# Patient Record
Sex: Female | Born: 2000
Health system: Southern US, Community
[De-identification: ages and names within clinical notes are randomized; demographics above are authoritative.]

## PROBLEM LIST (undated history)

## (undated) DIAGNOSIS — F419 Anxiety disorder, unspecified: Secondary | ICD-10-CM

## (undated) DIAGNOSIS — E063 Autoimmune thyroiditis: Secondary | ICD-10-CM

## (undated) DIAGNOSIS — E049 Nontoxic goiter, unspecified: Secondary | ICD-10-CM

## (undated) DIAGNOSIS — E7800 Pure hypercholesterolemia, unspecified: Secondary | ICD-10-CM

## (undated) DIAGNOSIS — R5383 Other fatigue: Secondary | ICD-10-CM

## (undated) HISTORY — DX: Pure hypercholesterolemia, unspecified: E78.00

## (undated) HISTORY — DX: Anxiety disorder, unspecified: F41.9

## (undated) HISTORY — DX: Nontoxic goiter, unspecified: E04.9

## (undated) HISTORY — DX: Autoimmune thyroiditis: E06.3

## (undated) HISTORY — PX: NO PAST SURGERIES: SHX2092

---
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
## 2019-01-02 ENCOUNTER — Encounter: Payer: Self-pay | Admitting: Internal Medicine

## 2019-01-02 ENCOUNTER — Telehealth: Payer: Self-pay | Admitting: Internal Medicine

## 2019-01-02 ENCOUNTER — Other Ambulatory Visit: Payer: Self-pay

## 2019-01-02 ENCOUNTER — Telehealth (INDEPENDENT_AMBULATORY_CARE_PROVIDER_SITE_OTHER): Payer: BC Managed Care – PPO | Admitting: Internal Medicine

## 2019-01-02 DIAGNOSIS — E038 Other specified hypothyroidism: Secondary | ICD-10-CM

## 2019-01-02 DIAGNOSIS — E7800 Pure hypercholesterolemia, unspecified: Secondary | ICD-10-CM | POA: Diagnosis not present

## 2019-01-02 DIAGNOSIS — E063 Autoimmune thyroiditis: Secondary | ICD-10-CM

## 2019-01-02 MED ORDER — PRAVASTATIN SODIUM 40 MG PO TABS: 40.0000 mg | ORAL_TABLET | Freq: Every day | ORAL | 3 refills | Status: DC

## 2019-01-02 NOTE — Patient Instructions (Signed)
Medication Instructions:  INCREASE pravastatin to 40mg  daily If you need a refill on your cardiac medications before your next appointment, please call your pharmacy.   Lab work: FASTING lab work in 3 months to check cholesterol - prior to next appointment If you have labs (blood work) drawn today and your tests are completely normal, you will receive your results only by: Marland Kitchen MyChart Message (if you have MyChart) OR . A paper copy in the mail If you have any lab test that is abnormal or we need to change your treatment, we will call you to review the results.  Testing/Procedures: NONE  Follow-Up: Dr. Debara Pickett recommends that you schedule a follow up visit with him the in the [place] in 3 months. Please have fasting blood work about 1 week prior to this visit and he will review the blood work results with you at your appointment.

## 2019-01-02 NOTE — Telephone Encounter (Signed)
LVM for patient to call and schedule 3 month lipid followup with Dr. Debara Pickett.

## 2019-01-02 NOTE — Progress Notes (Signed)
Virtual Visit via Telephone Note   This visit type was conducted due to national recommendations for restrictions regarding the COVID-19 Pandemic (e.g. social distancing) in an effort to limit this patient's exposure and mitigate transmission in our community.  Due to her co-morbid illnesses, this patient is at least at moderate risk for complications without adequate follow up.  This format is felt to be most appropriate for this patient at this time.  The patient did not have access to video technology/had technical difficulties with video requiring transitioning to audio format only (telephone).  All issues noted in this document were discussed and addressed.  No physical exam could be performed with this format.  Please refer to the patient's chart for her  consent to telehealth for [facility].   Evaluation Performed:  Lipid consult  Date:  01/02/2019   ID:  Pamela [name], DOB [birth year], MRN [id number]  Patient Location:  [address] [place] [address]  Provider location:   [address], [address] [place], [place] [address]  PCP:  Bing NeighborsHarris, Kimberly S, FNP  Cardiologist:  No primary care provider on file. Electrophysiologist:  None   Chief Complaint:  Manage dyslipidemia  History of Present Illness:    Pamela HughsKatie L Hancher is a 18 y.o. female who presents via audio/video conferencing for a telehealth visit today.  This is a pleasant 18 year old female was kindly referred for evaluation and management of dyslipidemia.  Her past medical history is significant for hypothyroidism secondary to presumed Hashimoto's thyroiditis.  She has been on thyroid medication since she was about 11 and at the time was found to have significant dyslipidemia.  She was started on pravastatin 20 mg daily.  She recently had a lipid profile in June which showed total cholesterol 173, HDL 38, triglycerides 48 and LDL of 120.  As she is at low risk overall, would suspect that this represents a good target LDL  however further evaluation was undertaken.  She did have a lipid NMR performed as well as APO B and LP(a) levels.  Her NMR demonstrated LDL-C of 126 with LDL particle number of 1523.  This was primarily revealing and it showed her small LDL particle number was mildly elevated at 531.  Overall, not significantly abnormal given her risk factors, however LDL particle numbers were higher than expected for her LDL-C.  This is represented by her APO B level which was mildly elevated as well, despite therapy with pravastatin.  The patient does not have symptoms concerning for COVID-19 infection (fever, chills, cough, or new SHORTNESS OF BREATH).    Prior CV studies:   The following studies were reviewed today:  Lab work  PMHx:  Past Medical History:  Diagnosis Date   Anxiety    Fatigue 12/09/2017   Generalized anxiety disorder 11/27/2014   Goiter    Hypercholesterolemia without hypertriglyceridemia    Hypothyroidism, acquired, autoimmune    Pure hypercholesterolemia 09/25/2010   Thyroiditis, autoimmune    Underweight 05/12/2012    Past Surgical History:  Procedure Laterality Date   NO PAST SURGERIES      FAMHx:  Family History  Problem Relation Age of Onset   Hyperlipidemia Mother    Depression Mother    Diabetes Mother    Hypertension Mother    Thyroid disease Maternal Aunt    Hyperlipidemia Maternal Grandmother    Cancer Maternal Grandmother    Graves' disease Maternal Grandmother        Treated with I-131 for Graves' disease.  CAD Maternal Grandmother    Hypertension Maternal Grandfather    Diabetes Paternal Grandmother    Heart disease Paternal Grandfather    Heart attack Paternal Grandfather    Depression Sister    Anxiety disorder Sister    Depression Maternal Aunt    Heart attack Maternal Great-grandmother    Stroke Neg Hx     SOCHx:   reports that she is a non-smoker but has been exposed to tobacco smoke. She has never used smokeless  tobacco. She reports that she does not drink alcohol or use drugs.  ALLERGIES:  No Known Allergies  MEDS:  Current Meds  Medication Sig   FLUoxetine (PROZAC) 20 MG capsule TAKE 1 CAPSULE BY MOUTH AT BEDTIME FOR ANXIETY   medroxyPROGESTERone Acetate 150 MG/ML SUSY INJECT 1ML INTRAMUSCULARLY ONCE EVERY 3 MONTHS   pravastatin (PRAVACHOL) 20 MG tablet Take 1 tablet by mouth once daily   SYNTHROID 50 MCG tablet Take 1 tablet (50 mcg total) by mouth daily before breakfast. Brand Name Medically Necessary     ROS: Pertinent items noted in HPI and remainder of comprehensive ROS otherwise negative.  Labs/Other Tests and Data Reviewed:    Recent Labs: 10/04/2018: ALT 11; TSH 3.260 11/03/2018: BUN 12; Creatinine, Ser 0.92; Hemoglobin 14.0; Platelets 220; Potassium 4.2; Sodium 141   Recent Lipid Panel Lab Results  Component Value Date/Time   CHOL 173 (H) 10/12/2018 12:00 AM   TRIG 48 10/12/2018 12:00 AM   HDL 38 (L) 10/12/2018 12:00 AM   CHOLHDL 4.6 10/12/2018 12:00 AM   LDLCALC 120 (H) 10/12/2018 12:00 AM    Wt Readings from Last 3 Encounters:  12/07/18 108 lb 3.2 oz (49.1 kg) (15 %, Z= -1.05)*  10/21/18 107 lb (48.5 kg) (13 %, Z= -1.12)*  10/12/18 106 lb 9.6 oz (48.4 kg) (13 %, Z= -1.15)*   * Growth percentiles are based on CDC (Girls, 2-20 Years) data.     Exam:    Vital Signs:  There were no vitals taken for this visit.   Exam not performed due to telephone visit  ASSESSMENT & PLAN:    1. Mixed dyslipidemia 2. Low cardiac risk  Ms. Jolene ProvostYawn has had higher than expected cholesterol for years on moderately intense statin therapy with early onset hypothyroidism. There is family history of dyslipidemia and coronary disease, but not early onset. Her lipids suggest an excess particle number that is mildly disproportinate compared to her LDL-C, as evidenced by elevated apoB levels. Since she has been committed to treatment, I would recommend a little more aggressive lowering of  LDL-P and recommend we increase her pravastatin to 40 mg daily. Will repeat lipids in 3 months.   COVID-19 Education: The signs and symptoms of COVID-19 were discussed with the patient and how to seek care for testing (follow up with PCP or arrange E-visit).  The importance of social distancing was discussed today.  Patient Risk:   After full review of this patients clinical status, I feel that they are at least moderate risk at this time.  Time:   Today, I have spent 25 minutes with the patient with telehealth technology discussing lipid management, cardiac risk factors, family history.     Medication Adjustments/Labs and Tests Ordered: Current medicines are reviewed at length with the patient today.  Concerns regarding medicines are outlined above.   Tests Ordered: No orders of the defined types were placed in this encounter.   Medication Changes: No orders of the defined types were placed in this  encounter.   Disposition:  in 3 month(s)  Pixie Casino, MD, [facility], [place] Director of the Advanced Lipid Disorders &  Cardiovascular Risk Reduction Clinic Diplomate of the American Board of Clinical Lipidology Attending Cardiologist  Direct Dial: [phone]   Fax: [phone]  Website:  [email or web address]  Pixie Casino, MD  01/02/2019 10:13 AM

## 2019-01-10 ENCOUNTER — Telehealth: Payer: Self-pay

## 2019-01-12 ENCOUNTER — Encounter: Payer: Self-pay | Admitting: Internal Medicine

## 2019-01-12 ENCOUNTER — Telehealth (INDEPENDENT_AMBULATORY_CARE_PROVIDER_SITE_OTHER): Payer: BC Managed Care – PPO | Admitting: Internal Medicine

## 2019-01-12 ENCOUNTER — Other Ambulatory Visit: Payer: Self-pay

## 2019-01-12 VITALS — Wt 107.0 lb

## 2019-01-12 DIAGNOSIS — I951 Orthostatic hypotension: Secondary | ICD-10-CM | POA: Diagnosis not present

## 2019-01-12 DIAGNOSIS — G90A Postural orthostatic tachycardia syndrome (POTS): Secondary | ICD-10-CM

## 2019-01-12 DIAGNOSIS — R000 Tachycardia, unspecified: Secondary | ICD-10-CM

## 2019-01-12 DIAGNOSIS — R55 Syncope and collapse: Secondary | ICD-10-CM

## 2019-01-12 NOTE — Progress Notes (Signed)
Electrophysiology TeleHealth Note   Due to national recommendations of social distancing due to COVID 19, an audio/video telehealth visit is felt to be most appropriate for this patient at this time.  See MyChart message from today for the patient's consent to telehealth for [facility].   Date:  01/12/2019   ID:  Pamela [name], DOB Jul 11, 2000, MRN [id number]  Location: patient's home  Provider location: [address], [place]  Evaluation Performed: Follow-up visit  PCP:  Scot Jun, FNP  Cardiologist:   Electrophysiologist:  SK   Chief Complaint:  Dizziness   History of Present Illness:    Pamela [name] is a 18 y.o. female who presents via audio/video conferencing for a telehealth visit today.  Since last being seen in our clinic for postural tachycardia and syncope  the patient reports doing better  Less orthostatic with standing So prolonged standing is still a challenge  Hx of arachnodactyly and joint laxity.  Anxiety   Tried Tri-oral but did not tolerate  Works at [employer] not hot.    Also complains of pain rather diffusely     The patient denies symptoms of fevers, chills, cough, or new SOB worrisome for COVID 19.    Past Medical History:  Diagnosis Date  . Anxiety   . Fatigue 12/09/2017  . Generalized anxiety disorder 11/27/2014  . Goiter   . Hypercholesterolemia without hypertriglyceridemia   . Hypothyroidism, acquired, autoimmune   . Pure hypercholesterolemia 09/25/2010  . Thyroiditis, autoimmune   . Underweight 05/12/2012    Past Surgical History:  Procedure Laterality Date  . NO PAST SURGERIES      Current Outpatient Medications  Medication Sig Dispense Refill  . FLUoxetine (PROZAC) 20 MG capsule TAKE 1 CAPSULE BY MOUTH AT BEDTIME FOR ANXIETY  2  . medroxyPROGESTERone Acetate 150 MG/ML SUSY INJECT 1ML INTRAMUSCULARLY ONCE EVERY 3 MONTHS    . pravastatin (PRAVACHOL) 40 MG tablet Take 1 tablet (40 mg total) by  mouth daily. 90 tablet 3  . SYNTHROID 50 MCG tablet Take 1 tablet (50 mcg total) by mouth daily before breakfast. Brand Name Medically Necessary 30 tablet 6   No current facility-administered medications for this visit.     Allergies:   Patient has no known allergies.   Social History:  The patient  reports that she is a non-smoker but has been exposed to tobacco smoke. She has never used smokeless tobacco. She reports that she does not drink alcohol or use drugs.   Family History:  The patient's   family history includes Anxiety disorder in her sister; CAD in her maternal grandmother; Cancer in her maternal grandmother; Depression in her maternal aunt, mother, and sister; Diabetes in her mother and paternal grandmother; Berenice Primas' disease in her maternal grandmother; Heart attack in her maternal great-grandmother and paternal grandfather; Heart disease in her paternal grandfather; Hyperlipidemia in her maternal grandmother and mother; Hypertension in her maternal grandfather and mother; Thyroid disease in her maternal aunt.   ROS:  Please see the history of present illness.   All other systems are personally reviewed and negative.    Exam:    Vital Signs:  There were no vitals taken for this visit.    Well appearing, alert and conversant, regular work of breathing,  good skin color Eyes- anicteric, neuro- grossly intact, skin- no apparent rash or lesions or cyanosis, mouth- oral mucosa is pink   Labs/Other Tests and Data Reviewed:    Recent Labs:  10/04/2018: ALT 11; TSH 3.260 11/03/2018: BUN 12; Creatinine, Ser 0.92; Hemoglobin 14.0; Platelets 220; Potassium 4.2; Sodium 141   Wt Readings from Last 3 Encounters:  12/07/18 108 lb 3.2 oz (49.1 kg) (15 %, Z= -1.05)*  10/21/18 107 lb (48.5 kg) (13 %, Z= -1.12)*  10/12/18 106 lb 9.6 oz (48.4 kg) (13 %, Z= -1.15)*   * Growth percentiles are based on CDC (Girls, 2-20 Years) data.     Other studies personally reviewed:     ASSESSMENT &  PLAN:    Dysautonomia with evidence of POTS but also syncope  Arachnodactyly  Joint laxity  Anxiety  Not tolerated tir-oral  Have vercommended she try' Peidalyte [facility] or Liquid IV Need to keep visual track of urine color Requested and appropriate so will get her handicapped sticker Raised the issue of disablitty  m ay consider florinef    COVID 19 screen The patient denies symptoms of COVID 19 at this time.  The importance of social distancing was discussed today.  Follow-up:4 weeks  telehealth visit      Current medicines are reviewed at length with the patient today.     No orders of the defined types were placed in this encounter.   Future tests ( post COVID )     Patient Risk:  after full review of this patients clinical status, I feel that they are at moderate  risk at this time.  Today, I have spent 16 minutes with the patient with telehealth technology discussing the above.  Signed, Sherryl MangesSteven , MD  01/12/2019 11:42 AM     [facility] [address] [place] [address] [phone] (office) [phone] (fax)

## 2019-01-12 NOTE — Patient Instructions (Signed)
Medication Instructions: Your physician recommends that you continue on your current medications as directed. Please refer to the Current Medication list given to you today.  If you need a refill on your cardiac medications before your next appointment, please call your pharmacy.   Lab work:none ordered If you have labs (blood work) drawn today and your tests are completely normal, you will receive your results only by: Marland Kitchen MyChart Message (if you have MyChart) OR . A paper copy in the mail If you have any lab test that is abnormal or we need to change your treatment, we will call you to review the results.  Testing/Procedures: None ordered  Follow-Up: Dr. Caryl Comes in 4 weeks for TELEVISIT  At [facility], you and your health needs are our priority.  As part of our continuing mission to provide you with exceptional heart care, we have created designated Provider Care Teams.  These Care Teams include your primary Cardiologist (physician) and Advanced Practice Providers (APPs -  Physician Assistants and Nurse Practitioners) who all work together to provide you with the care you need, when you need it.   Any Other Special Instructions Will Be Listed Below (If Applicable).

## 2019-01-17 ENCOUNTER — Telehealth: Payer: Self-pay | Admitting: Internal Medicine

## 2019-01-17 NOTE — Telephone Encounter (Signed)
Handicap form left at front desk.

## 2019-01-17 NOTE — Telephone Encounter (Signed)
New message   Called pt to schedule 4 week f/u appt per Dr. Caryl Comes. Pt said that when she spoke to Dr. Caryl Comes last she mentioned a disability parking pass and he said someone would call her and she never got a call. Please call

## 2019-01-23 ENCOUNTER — Telehealth: Payer: Self-pay | Admitting: Internal Medicine

## 2019-01-23 NOTE — Telephone Encounter (Signed)
  Patient came to office today to pick up a letter from Dr Caryl Comes for her to get a handicap sticker for her car. The mom is calling because they were told no one could find the letter but she states she was told to come get it that it was ready. She is off tomorrow and would like to come pick it. Please call when ready.

## 2019-01-24 NOTE — Telephone Encounter (Signed)
Spoke with pt's mother. Pt's handicap letter was placed in the mail. She should be receiving it within the week. If she does not, I will have another one filled out and sent to her.   She had no additional questions.

## 2019-02-02 NOTE — Telephone Encounter (Signed)
Patient's mother would like a call about the handicap letter that they never received.  Please call, thank you.

## 2019-02-02 NOTE — Telephone Encounter (Signed)
  Mother is calling because the letter for the handicap sticker was never received and they were told by the nurse that Dr Caryl Comes would be in the office on Tuesday 01/31/19 and would do another one for them to pick up. She is calling today because they have not heard anything from the office about the letter being ready. Please advise.

## 2019-02-06 NOTE — Telephone Encounter (Signed)
  Patient's mom called to check on letter for the handicap sticker. I spoke with Ander Purpura and she stated Dr Caryl Comes will be in the office on 02/07/19 and she will get another letter from him and put it up front for mom to pick up. Mom is asking to be able to pick up letter tomorrow before end of the day.

## 2019-02-06 NOTE — Telephone Encounter (Signed)
Appointment

## 2019-02-07 NOTE — Telephone Encounter (Signed)
Handicap paper left downstairs with check in. Pt is aware.

## 2019-02-20 ENCOUNTER — Telehealth (INDEPENDENT_AMBULATORY_CARE_PROVIDER_SITE_OTHER): Payer: Self-pay | Admitting: Pediatric Endocrinology

## 2019-02-20 NOTE — Telephone Encounter (Signed)
Returned call to mom.   She saw a lipid specialist who increased the Pravachol from 20 mg to 40 mg. Since then she has increased joint pain. Her cardiologist said that the joint pain could be related to the increased dose.   1) ok to resume 20 mg of Pravachol 2) Should call the provider who prescribed the increased dose to let him know that you are having issues.  3) If symptome improve on the lower dose then it will be easier to say that the Pravachol may be causative.  4) if concerns for Marfans or Erlos Danlos Syndrome- should see ophthalmology for dilated exam.   Mom and Shiree were thankful for call.   Lelon Huh, MD

## 2019-02-20 NOTE — Telephone Encounter (Signed)
Routed to provider

## 2019-02-20 NOTE — Telephone Encounter (Signed)
°  Who's calling (name and relationship to patient) : Kuhnle,Sherry Best contact number: [phone] Provider they see: Baldo Ash Reason for call: Shaneeka is being treated for POTS by a cardiologist.  She has resently started to have joint pain that could possibly be related to the increase in her Pravachol.  Mom would like Dr. Montey Hora opinion of this.  Please call.    PRESCRIPTION REFILL ONLY  Name of prescription:  Pharmacy:

## 2019-03-03 ENCOUNTER — Telehealth (INDEPENDENT_AMBULATORY_CARE_PROVIDER_SITE_OTHER): Payer: BC Managed Care – PPO | Admitting: Internal Medicine

## 2019-03-03 ENCOUNTER — Encounter: Payer: Self-pay | Admitting: Internal Medicine

## 2019-03-03 ENCOUNTER — Other Ambulatory Visit: Payer: Self-pay

## 2019-03-03 VITALS — Ht 65.36 in | Wt 105.0 lb

## 2019-03-03 DIAGNOSIS — I951 Orthostatic hypotension: Secondary | ICD-10-CM

## 2019-03-03 DIAGNOSIS — I10 Essential (primary) hypertension: Secondary | ICD-10-CM

## 2019-03-03 NOTE — Progress Notes (Signed)
Electrophysiology TeleHealth Note   Due to national recommendations of social distancing due to COVID 19, an audio/video telehealth visit is felt to be most appropriate for this patient at this time.  See MyChart message from today for the patient's consent to telehealth for [facility].   Date:  03/03/2019   ID:  Pamela [name], DOB 30-Aug-2000, MRN [id number]  Location: patient's home  Provider location: [address] [place]  Evaluation Performed: Follow-up visit  PCP:  Bing Neighbors, FNP  Cardiologist:   [facility] Electrophysiologist:  SK   Chief Complaint:  dysautonomia  History of Present Illness:    Pamela [name] is a 18 y.o. female who presents via audio/video conferencing for a telehealth visit today.  Since last being seen in our clinic forPOTS/syncope with arachnodactyly initially improved with volume support  the patient reports   Still with orthostatic dizziness, but no syncope--temporally assoc with her period.   Also with orhtostatic headaches-- worse with standing.    Discussed alternative salt/water repletion, did not tolerate tri-oral  Lots of joint pain--   The patient denies symptoms of fevers, chills, cough, or new SOB worrisome for COVID 19.   Past Medical History:  Diagnosis Date  . Anxiety   . Fatigue 12/09/2017  . Generalized anxiety disorder 11/27/2014  . Goiter   . Hypercholesterolemia without hypertriglyceridemia   . Hypothyroidism, acquired, autoimmune   . Pure hypercholesterolemia 09/25/2010  . Thyroiditis, autoimmune   . Underweight 05/12/2012    Past Surgical History:  Procedure Laterality Date  . NO PAST SURGERIES      Current Outpatient Medications  Medication Sig Dispense Refill  . FLUoxetine (PROZAC) 20 MG capsule TAKE 1 CAPSULE BY MOUTH AT BEDTIME FOR ANXIETY  2  . medroxyPROGESTERone Acetate 150 MG/ML SUSY INJECT INTRAMUSCULARLY ONCE EVERY 3 MONTHS    . pravastatin (PRAVACHOL) 40 MG tablet Take 1 tablet (40 mg  total) by mouth daily. 90 tablet 3  . SYNTHROID 50 MCG tablet Take 1 tablet (50 mcg total) by mouth daily before breakfast. Brand Name Medically Necessary 30 tablet 6   No current facility-administered medications for this visit.     Allergies:   Patient has no known allergies.   Social History:  The patient  reports that she is a non-smoker but has been exposed to tobacco smoke. She has never used smokeless tobacco. She reports that she does not drink alcohol or use drugs.   Family History:  The patient's   family history includes Anxiety disorder in her sister; CAD in her maternal grandmother; Cancer in her maternal grandmother; Depression in her maternal aunt, mother, and sister; Diabetes in her mother and paternal grandmother; Luiz Blare' disease in her maternal grandmother; Heart attack in her maternal great-grandmother and paternal grandfather; Heart disease in her paternal grandfather; Hyperlipidemia in her maternal grandmother and mother; Hypertension in her maternal grandfather and mother; Thyroid disease in her maternal aunt.   ROS:  Please see the history of present illness.   All other systems are personally reviewed and negative.    Exam:    Vital Signs:  Ht 5' 5.36" (1.66 m)   Wt 105 lb (47.6 kg)   BMI 17.28 kg/m     Labs/Other Tests and Data Reviewed:    Recent Labs: 10/04/2018: ALT 11; TSH 3.260 11/03/2018: BUN 12; Creatinine, Ser 0.92; Hemoglobin 14.0; Platelets 220; Potassium 4.2; Sodium 141   Wt Readings from Last 3 Encounters:  03/03/19 105 lb (47.6  kg) (9 %, Z= -1.32)*  01/12/19 107 lb (48.5 kg) (13 %, Z= -1.15)*  12/07/18 108 lb 3.2 oz (49.1 kg) (15 %, Z= -1.05)*   * Growth percentiles are based on CDC (Girls, 2-20 Years) data.     Other studies personally reviewed:     ASSESSMENT & PLAN:   Dysautonomia with evidence of POTS but also syncope  Arachnodactyly  Joint laxity  Anxiety  Still with dizziness with orthostasis but no interval syncope.  No  clear culprit symptoms associated temporally with her period coming on.  Encouraged salt and water repletion   Have reached out to DR Valentino Nose as to whether there is a joint laxity EDS MD in the area   COVID 19 screen The patient denies symptoms of COVID 19 at this time.  The importance of social distancing was discussed today.  Follow-up:  4-6w telehealth visit      Current medicines are reviewed at length with the patient today.   The patient does not have concerns regarding her medicines.  The following changes were made today:  none  Labs/ tests ordered today include:   No orders of the defined types were placed in this encounter.   Future tests ( post COVID )    in    months  Patient Risk:  after full review of this patients clinical status, I feel that they are at moderate risk at this time.  Today, I have spent 12 minutes with the patient with telehealth technology discussing the above.  Signed, Virl Axe, MD  03/03/2019 3:58 PM     [place] [address] [place] [address] [phone] (office) [phone] (fax)

## 2019-03-16 ENCOUNTER — Other Ambulatory Visit (INDEPENDENT_AMBULATORY_CARE_PROVIDER_SITE_OTHER): Payer: Self-pay | Admitting: Pediatric Endocrinology

## 2019-03-17 DIAGNOSIS — Z3042 Encounter for surveillance of injectable contraceptive: Secondary | ICD-10-CM | POA: Diagnosis not present

## 2019-04-17 ENCOUNTER — Ambulatory Visit (INDEPENDENT_AMBULATORY_CARE_PROVIDER_SITE_OTHER): Payer: Self-pay | Admitting: Pediatric Endocrinology

## 2019-04-20 ENCOUNTER — Telehealth (INDEPENDENT_AMBULATORY_CARE_PROVIDER_SITE_OTHER): Payer: Self-pay | Admitting: Internal Medicine

## 2019-04-20 ENCOUNTER — Other Ambulatory Visit: Payer: Self-pay

## 2019-04-20 ENCOUNTER — Encounter: Payer: Self-pay | Admitting: Internal Medicine

## 2019-04-20 VITALS — Ht 65.37 in | Wt 110.0 lb

## 2019-04-20 DIAGNOSIS — I498 Other specified cardiac arrhythmias: Secondary | ICD-10-CM

## 2019-04-20 DIAGNOSIS — E063 Autoimmune thyroiditis: Secondary | ICD-10-CM

## 2019-04-20 DIAGNOSIS — R55 Syncope and collapse: Secondary | ICD-10-CM

## 2019-04-20 DIAGNOSIS — I951 Orthostatic hypotension: Secondary | ICD-10-CM

## 2019-04-20 DIAGNOSIS — E7800 Pure hypercholesterolemia, unspecified: Secondary | ICD-10-CM

## 2019-04-20 DIAGNOSIS — G90A Postural orthostatic tachycardia syndrome (POTS): Secondary | ICD-10-CM

## 2019-04-20 DIAGNOSIS — E038 Other specified hypothyroidism: Secondary | ICD-10-CM

## 2019-04-20 NOTE — Progress Notes (Signed)
[name]    Electrophysiology TeleHealth Note   Due to national recommendations of social distancing due to [place] 19, an audio/video telehealth visit is felt to be most appropriate for this patient at this time.  See MyChart message from today for the patient's consent to telehealth for [name].   Date:  04/20/2019   ID:  Pamela [name], DOB Oct 18, 2000, MRN [id number]  Location: patient's home  Provider location: [address] [place]  Evaluation Performed: Follow-up visit  PCP:  Scot Jun, FNP  Cardiologist:   [facility] Electrophysiologist:  SK   Chief Complaint:  dysautonomia  History of Present Illness:    Pamela [name] is a 18 y.o. female who presents via audio/video conferencing for a telehealth visit today.  Since last being seen in our clinic forPOTS/syncope with arachnodactyly initially improved with volume support  the patient reports recent recurrences of syncope  Palps with exertion  Has tried some of the salt repletion formulas but does not like them--     The patient denies symptoms of fevers, chills, cough, or new SOB worrisome for COVID 19.   Past Medical History:  Diagnosis Date  . Anxiety   . Fatigue 12/09/2017  . Generalized anxiety disorder 11/27/2014  . Goiter   . Hypercholesterolemia without hypertriglyceridemia   . Hypothyroidism, acquired, autoimmune   . Pure hypercholesterolemia 09/25/2010  . Thyroiditis, autoimmune   . Underweight 05/12/2012    Past Surgical History:  Procedure Laterality Date  . NO PAST SURGERIES      Current Outpatient Medications  Medication Sig Dispense Refill  . FLUoxetine (PROZAC) 20 MG capsule TAKE 1 CAPSULE BY MOUTH AT BEDTIME FOR ANXIETY  2  . medroxyPROGESTERone Acetate 150 MG/ML SUSY INJECT 1ML INTRAMUSCULARLY ONCE EVERY 3 MONTHS    . pravastatin (PRAVACHOL) 40 MG tablet Take 1 tablet (40 mg total) by mouth daily. 90 tablet 3  . SYNTHROID 50 MCG tablet TAKE 1 TABLET BY MOUTH ONCE DAILY BEFORE  BREAKFAST 90 tablet 1   No current facility-administered medications for this visit.    Allergies:   Patient has no known allergies.   Social History:  The patient  reports that she is a non-smoker but has been exposed to tobacco smoke. She has never used smokeless tobacco. She reports that she does not drink alcohol or use drugs.   Family History:  The patient's   family history includes Anxiety disorder in her sister; CAD in her maternal grandmother; Cancer in her maternal grandmother; Depression in her maternal aunt, mother, and sister; Diabetes in her mother and paternal grandmother; Berenice Primas' disease in her maternal grandmother; Heart attack in her maternal great-grandmother and paternal grandfather; Heart disease in her paternal grandfather; Hyperlipidemia in her maternal grandmother and mother; Hypertension in her maternal grandfather and mother; Thyroid disease in her maternal aunt.   ROS:  Please see the history of present illness.   All other systems are personally reviewed and negative.    Exam:    Vital Signs:  Ht 5' 5.37" (1.66 m)   Wt 110 lb (49.9 kg)   BMI 18.10 kg/m     Labs/Other Tests and Data Reviewed:    Recent Labs: 10/04/2018: ALT 11; TSH 3.260 11/03/2018: BUN 12; Creatinine, Ser 0.92; Hemoglobin 14.0; Platelets 220; Potassium 4.2; Sodium 141   Wt Readings from Last 3 Encounters:  04/20/19 110 lb (49.9 kg) (17 %, Z= -0.96)*  03/03/19 105 lb (47.6 kg) (9 %, Z= -1.32)*  01/12/19 107 lb (48.5  kg) (13 %, Z= -1.15)*   * Growth percentiles are based on CDC (Girls, 2-20 Years) data.     Other studies personally reviewed:     ASSESSMENT & PLAN:   Dysautonomia with evidence of POTS but also syncope  Arachnodactyly  Joint laxity  Anxiety  No clear trigger of worsening syncope--have encouraged her to try Pedialyte advanced Care as a supplement  Encouraged exercise  COVID 19 screen The patient denies symptoms of COVID 19 at this time.  The importance of  social distancing was discussed today.  Follow-up:  8 w telehealth visit      Current medicines are reviewed at length with the patient today.   The patient does not have concerns regarding her medicines.  The following changes were made today:  none  Labs/ tests ordered today include:   No orders of the defined types were placed in this encounter.   Future tests ( post COVID )    in    months  Patient Risk:  after full review of this patients clinical status, I feel that they are at moderate risk at this time.  Today, I have spent 12  minutes with the patient with telehealth technology discussing the above.  Signed, Sherryl Manges, MD  04/20/2019 2:02 PM     [facility] [address] [place] [address] [phone] (office) [phone] (fax)

## 2019-05-22 ENCOUNTER — Encounter (INDEPENDENT_AMBULATORY_CARE_PROVIDER_SITE_OTHER): Payer: Self-pay | Admitting: Pediatric Endocrinology

## 2019-05-22 ENCOUNTER — Ambulatory Visit (INDEPENDENT_AMBULATORY_CARE_PROVIDER_SITE_OTHER): Payer: Self-pay | Admitting: Pediatric Endocrinology

## 2019-05-22 ENCOUNTER — Other Ambulatory Visit: Payer: Self-pay

## 2019-05-22 VITALS — BP 102/60 | HR 76 | Ht 65.35 in | Wt 106.3 lb

## 2019-05-22 DIAGNOSIS — E063 Autoimmune thyroiditis: Secondary | ICD-10-CM

## 2019-05-22 DIAGNOSIS — E7800 Pure hypercholesterolemia, unspecified: Secondary | ICD-10-CM

## 2019-05-22 MED ORDER — PRAVASTATIN SODIUM 20 MG PO TABS: 20.0000 mg | ORAL_TABLET | Freq: Every day | ORAL | 3 refills | Status: DC

## 2019-05-22 MED ORDER — LEVOTHYROXINE SODIUM 50 MCG PO TABS: ORAL_TABLET | ORAL | 3 refills | Status: DC

## 2019-05-22 NOTE — Patient Instructions (Signed)
Pravachol 20 mg Synthroid 50 mcg (generic ok).   Labs next visit

## 2019-05-22 NOTE — Progress Notes (Signed)
Subjective:  Patient Name: Pamela [name] Date of Birth: 2000/09/17  MRN: [id number]  Pamela [name]  presents to the office today for follow-up evaluation and management of her hypercholesterolemia, goiter, Hashimoto's thyroiditis, and hypothyroidism.   HISTORY OF PRESENT ILLNESS:   [name] is a 19 y.o. Caucasian female   [name] was accompanied by her mother   1. [name] was first referred to our clinic on 11/12/09 by her primary care pediatrician, Dr. Johny Drilling, for evaluation and management of elevated cholesterol. Patient was then 9-8/20 years old. She had been previously quite healthy. Dr. Rex Kras perform a routine screening for cholesterol in his office. When that result was high, he repeated the blood test. The second cholesterol was still high. Family history was positive for diabetes in a paternal grandmother. Mother and maternal aunt have had thyroid problems. Paternal grandfather had had a myocardial infarction. Maternal great-grandmother died of an MI. Mother's cholesterol was noted to be "a little high". Mother and maternal uncle were both obese. Paternal grandfather had hypertension. Review of Dr. Eddie Dibbles laboratory results showed total cholesterols of 227 and 288. Triglycerides were 57 and 58. HDLs were both 57. LDLs were 159 and 160. Additional laboratory studies included a normal CMP. Her TSH was slightly elevated at 4.063. Her free T4 was 1.42. Her free T3 was 4.8. Her TPO antibody was borderline elevated at 38.4. Because of the association of hypothyroidism with hypercholesterolemia, we repeated the TFTs one month later. TSH was even higher at 5.823. Free T4 was 1.37. Free T3 was 5.2. Repeat TFTs 2 months later showed that the TSH was 3.514. Free T4 was 1.12. Free T3 was 4.3. At that point, Dr. Tobe Sos started her on Synthroid, 25 mcg per day. Her cholesterol values initially seemed to respond to the thyroid hormone. However, she did have a total cholesterol of 267 on 04/25/11 and was started on  Pravachol at that time. She initially had some concerns regarding headaches and stomachaches but they resolved.       2. The patient's last [facility] visit was on 10/12/18. In the interim, she has been generally healthy.    Thyroid  She has continued on 50 mcg of Synthroid daily. She feels that she is doing well with it. She has been taking it daily.   Energy level is ok- she is often tired.  She denies constipation Menses are irregular- on Depot Provera.   She is working at [employer] and at [employer]. She wears a mask at work.   She tends to go to bed between 10p and MN. She is tired from working 3 jobs.   She has not been passing out as often. She has follows with cardiology.   Hyperlipidemia She has continued on Pravachol. She is taking it daily. She does not feel that she has any issues with it.   She was referred to a lipid specialist at her cardiology office who recommended that she increase her Pravachol to 40 mg daily due to mild increase in small LDL-particle, particle number, and LDL-C. She felt that she had an increase in joint pain with the increase in dose which improved when she resumed 20 mg daily.   She has switched to Depot Provera for her [facility] and is having irregular bleeding. She is scheduled to see her GYN for this issue.   She is still taking Prozac for her depression/anxiety. She is not currently seeing a therapist.    3. Pertinent Review of Systems:  Constitutional: The patient  feels "tired and sore".  Pain in hips/shoulders.   Eyes: Vision seems to be good. There are no recognized eye problems. Wears glasses for reading Neck: The patient has no complaints of anterior neck swelling, soreness, tenderness, pressure, discomfort, or difficulty swallowing.   Heart: Heart rate increases with exercise or other physical activity. The patient has no complaints of palpitations, irregular heart beats, chest pain, or chest pressure.   Lungs no wheezing or breathing  difficulty.  Gastrointestinal: Bowel movents seem normal. The patient has no complaints of excessive hunger, acid reflux, upset stomach, stomach aches or pains, diarrhea, or constipation.  Legs: Muscle mass and strength seem normal. There are no complaints of numbness, tingling, burning, or pain. No edema is noted.  Feet: There are no obvious foot problems. There are no complaints of numbness, tingling, burning, or pain. No edema is noted. Neurologic: There are no recognized problems with muscle movement and strength, sensation, or coordination. Having issues with panic attacks and insomnia GYN/GU: Now on Depot Provera.   PAST MEDICAL, FAMILY, AND SOCIAL HISTORY  Past Medical History:  Diagnosis Date  . Anxiety   . Fatigue 12/09/2017  . Generalized anxiety disorder 11/27/2014  . Goiter   . Hypercholesterolemia without hypertriglyceridemia   . Hypothyroidism, acquired, autoimmune   . Pure hypercholesterolemia 09/25/2010  . Thyroiditis, autoimmune   . Underweight 05/12/2012    Family History  Problem Relation Age of Onset  . Hyperlipidemia Mother   . Depression Mother   . Diabetes Mother   . Hypertension Mother   . Thyroid disease Maternal Aunt   . Hyperlipidemia Maternal Grandmother   . Cancer Maternal Grandmother   . Graves' disease Maternal Grandmother        Treated with I-131 for Graves' disease.  . CAD Maternal Grandmother   . Hypertension Maternal Grandfather   . Diabetes Paternal Grandmother   . Heart disease Paternal Grandfather   . Heart attack Paternal Grandfather   . Depression Sister   . Anxiety disorder Sister   . Depression Maternal Aunt   . Heart attack Maternal Great-grandmother   . Stroke Neg Hx      Current Outpatient Medications:  .  FLUoxetine (PROZAC) 20 MG capsule, TAKE 1 CAPSULE BY MOUTH AT BEDTIME FOR ANXIETY, Disp: , Rfl: 2 .  levothyroxine (SYNTHROID) 50 MCG tablet, TAKE 1 TABLET BY MOUTH ONCE DAILY BEFORE BREAKFAST, Disp: 90 tablet, Rfl: 3 .   medroxyPROGESTERone Acetate 150 MG/ML SUSY, INJECT INTRAMUSCULARLY ONCE EVERY 3 MONTHS, Disp: , Rfl:  .  pravastatin (PRAVACHOL) 20 MG tablet, Take 1 tablet (20 mg total) by mouth daily., Disp: 90 tablet, Rfl: 3  Allergies as of 05/22/2019  . (No Known Allergies)     reports that she is a non-smoker but has been exposed to tobacco smoke. She has never used smokeless tobacco. She reports that she does not drink alcohol or use drugs. Pediatric History  Patient Parents  . Alexander Bergeron (Mother)   Other Topics Concern  . Not on file  Social History Narrative   Lives with Mom, dad, sister, and 1 dog. Plays outside. Very active. No in school or college   12th grade at [employer]  - she has graduated HS. She is thinking about going into forensics.  Working at [employer]. Walking.  Primary Care Provider: Bing Neighbors, FNP  ROS: There are no other significant problems involving Kazandra's other body systems.   Objective:  Vital Signs:  BP 102/60   Pulse 76  Ht 5' 5.35" (1.66 m)   Wt 106 lb 4.8 oz (48.2 kg)   BMI 17.50 kg/m     Ht Readings from Last 3 Encounters:  05/22/19 5' 5.35" (1.66 m) (66 %, Z= 0.42)*  04/20/19 5' 5.37" (1.66 m) (67 %, Z= 0.43)*  03/03/19 5' 5.36" (1.66 m) (67 %, Z= 0.43)*   * Growth percentiles are based on CDC (Girls, 2-20 Years) data.   Wt Readings from Last 3 Encounters:  05/22/19 106 lb 4.8 oz (48.2 kg) (11 %, Z= -1.24)*  04/20/19 110 lb (49.9 kg) (17 %, Z= -0.96)*  03/03/19 105 lb (47.6 kg) (9 %, Z= -1.32)*   * Growth percentiles are based on CDC (Girls, 2-20 Years) data.   HC Readings from Last 3 Encounters:  No data found for [facility]   Body surface area is 1.49 meters squared. 66 %ile (Z= 0.42) based on CDC (Girls, 2-20 Years) Stature-for-age data based on Stature recorded on 05/22/2019. 11 %ile (Z= -1.24) based on CDC (Girls, 2-20 Years) weight-for-age data using vitals from 05/22/2019.    PHYSICAL EXAM:  Constitutional: The  patient appears healthy and well nourished. The patient's height and weight are normal for age. Weight is stable Head: The head is normocephalic.  Face: The face appears normal. There are no obvious dysmorphic features. Eyes: The eyes appear to be normally formed and spaced. Gaze is conjugate. There is no obvious arcus or proptosis. Moisture appears normal. Ears: The ears are normally placed and appear externally normal. Multiple ear piercings.  Mouth: The oropharynx and tongue appear normal. Dentition appears to be normal for age. Oral moisture is normal. Neck: The neck appears to be visibly normal. The thyroid gland is 13 grams in size. The consistency of the thyroid gland is normal. The thyroid gland is not tender to palpation. Lungs: Normal work of breathing Heart: normal pulses and peripheral perfusion Abdomen: The abdomen appears to be normal in size for the patient's age. There is no obvious hepatomegaly, splenomegaly, or other mass effect.  Arms: Muscle size and bulk are normal for age. Hands:  Phalangeal and metacarpophalangeal joints are normal. Palmar muscles are normal for age. Palmar skin is normal. Palmar moisture is also normal. Legs: Muscles appear normal for age. No edema is present. Feet: Feet are normally formed. Dorsalis pedal pulses are normal. Neurologic: Strength is normal for age in both the upper and lower extremities. Muscle tone is normal. Sensation to touch is normal in both the legs and feet.    LAB DATA:   Pending No results found for this or any previous visit (from the past 504 hour(s)).   Assessment and Plan:   ASSESSMENT: Lorna is a 19 y.o. caucasian female followed for autoimmune aquired hypothyroid and hyperlipidemia.    Hyperlipidemia - Reports good compliance with Pravachol- but prefers the 20 mg dose tabs - Thinks she is eating ok - Repeat labs next visit  Thyroid - Currently on 50 mcg of Synthroid daily - clinically euthyroid - Labs next visit.    PLAN:  1. Diagnostic: none today 2. Therapeutic: No change to synthroid or Pravachol- prescriptions sent to pharmacy and coupons from [facility] 3. Patient education:  Reveiwed medication compliance and diet compliance concerns.  Discussed transition to adult care- she can continue to come here for now (until age 1). Family reassured.   4. Follow-up: Return in about 3 months (around 08/20/2019).     Dessa Phi, MD  Level of Service: This visit lasted in excess  of 40 minutes. More than 50% of the visit was devoted to counseling.

## 2019-05-24 ENCOUNTER — Telehealth: Payer: Self-pay | Admitting: Radiology

## 2019-05-24 DIAGNOSIS — R55 Syncope and collapse: Secondary | ICD-10-CM | POA: Insufficient documentation

## 2019-05-24 DIAGNOSIS — G901 Familial dysautonomia [Riley-Day]: Secondary | ICD-10-CM | POA: Insufficient documentation

## 2019-05-24 NOTE — Telephone Encounter (Addendum)
Exercise treadmill test was ordered back in June 2020 when the treadmill room was closed due to [place]. Please advise if patient still needs test scheduled. Patient has seen Graciela Husbands a few times since order was placed.   If no longer needed please cancel order.

## 2019-05-24 NOTE — Telephone Encounter (Signed)
Please find out if patient has had any further CP 

## 2019-05-24 NOTE — Telephone Encounter (Signed)
Left message to call back  

## 2019-05-25 ENCOUNTER — Telehealth (INDEPENDENT_AMBULATORY_CARE_PROVIDER_SITE_OTHER): Payer: Self-pay | Admitting: Internal Medicine

## 2019-05-25 ENCOUNTER — Other Ambulatory Visit: Payer: Self-pay

## 2019-05-25 DIAGNOSIS — G901 Familial dysautonomia [Riley-Day]: Secondary | ICD-10-CM

## 2019-05-25 DIAGNOSIS — R55 Syncope and collapse: Secondary | ICD-10-CM

## 2019-05-25 NOTE — Progress Notes (Signed)
Electrophysiology TeleHealth Note   Due to national recommendations of social distancing due to COVID 19, an audio/video telehealth visit is felt to be most appropriate for this patient at this time.  See MyChart message from today for the patient's consent to telehealth for [facility].   Date:  05/25/2019   ID:  Pamela [name], DOB 19-Sep-2000, MRN [id number]  Location: patient's home  Provider location: [address] [place]  Evaluation Performed: Follow-up visit  PCP:  Scot Jun, FNP    Electrophysiologist:  SK   Chief Complaint Dysautonomia  History of Present Illness:    Pamela [name] is a 19 y.o. female who presents via audio/video conferencing for a telehealth visit today.  Since last being seen in our clinic for dysatuonomia and more recently syncope, the patient reports doing considerably better, with less than half as much syncope  Intolerant of thermatabs,( buffered salt), has not tried pedialyte advanced care but has increased her water intake considerably.  Trying to eat more salt Compressive wear helps some  She is encouraged     The patient denies symptoms of fevers, chills, cough, or new SOB worrisome for COVID 19.   Past Medical History:  Diagnosis Date  . Anxiety   . Fatigue 12/09/2017  . Generalized anxiety disorder 11/27/2014  . Goiter   . Hypercholesterolemia without hypertriglyceridemia   . Hypothyroidism, acquired, autoimmune   . Pure hypercholesterolemia 09/25/2010  . Thyroiditis, autoimmune   . Underweight 05/12/2012    Past Surgical History:  Procedure Laterality Date  . NO PAST SURGERIES      Current Outpatient Medications  Medication Sig Dispense Refill  . FLUoxetine (PROZAC) 20 MG capsule TAKE 1 CAPSULE BY MOUTH AT BEDTIME FOR ANXIETY  2  . levothyroxine (SYNTHROID) 50 MCG tablet TAKE 1 TABLET BY MOUTH ONCE DAILY BEFORE BREAKFAST 90 tablet 3  . medroxyPROGESTERone Acetate 150 MG/ML SUSY INJECT 1ML  INTRAMUSCULARLY ONCE EVERY 3 MONTHS    . pravastatin (PRAVACHOL) 20 MG tablet Take 1 tablet (20 mg total) by mouth daily. 90 tablet 3   No current facility-administered medications for this visit.    Allergies:   Patient has no known allergies.   Social History:  The patient  reports that she is a non-smoker but has been exposed to tobacco smoke. She has never used smokeless tobacco. She reports that she does not drink alcohol or use drugs.   Family History:  The patient's   family history includes Anxiety disorder in her sister; CAD in her maternal grandmother; Cancer in her maternal grandmother; Depression in her maternal aunt, mother, and sister; Diabetes in her mother and paternal grandmother; Berenice Primas' disease in her maternal grandmother; Heart attack in her maternal great-grandmother and paternal grandfather; Heart disease in her paternal grandfather; Hyperlipidemia in her maternal grandmother and mother; Hypertension in her maternal grandfather and mother; Thyroid disease in her maternal aunt.   ROS:  Please see the history of present illness.   All other systems are personally reviewed and negative.    Exam:    Vital Signs:  BP 100/60   Ht 5\' 5"  (1.651 m)   Wt 106 lb (48.1 kg)   BMI 17.64 kg/m       Labs/Other Tests and Data Reviewed:    Recent Labs: 10/04/2018: ALT 11; TSH 3.260 11/03/2018: BUN 12; Creatinine, Ser 0.92; Hemoglobin 14.0; Platelets 220; Potassium 4.2; Sodium 141   Wt Readings from Last 3 Encounters:  05/25/19 106 lb (  48.1 kg) (10 %, Z= -1.27)*  05/22/19 106 lb 4.8 oz (48.2 kg) (11 %, Z= -1.24)*  04/20/19 110 lb (49.9 kg) (17 %, Z= -0.96)*   * Growth percentiles are based on CDC (Girls, 2-20 Years) data.     Other studies personally reviewed: Additional studies/ records that were reviewed today include:    Review of the above records today demonstrates:   Prior radiographs:      ASSESSMENT & PLAN:    Dysautonomia with evidence of POTS but also syncope   Arachnodactyly  Joint laxity  Anxiety  Overall some notable improvement,  Continue salt and water repletion recommended NUUN and flavored sodium rehydration formulas  Continue compressive wear  Not able to exercise,  Encouraged walking  COVID 19 screen The patient denies symptoms of COVID 19 at this time.  The importance of social distancing was discussed today.  Follow-up:  4 m    Current medicines are reviewed at length with the patient today.   The patient does not have concerns regarding her medicines.  The following changes were made today:  none  Labs/ tests ordered today include:   No orders of the defined types were placed in this encounter.   Future tests    Patient Risk:  after full review of this patients clinical status, I feel that they are at moderate* risk at this time.  Today, I have spent 8 minutes with the patient with telehealth technology discussing the above.  Signed, Sherryl Manges, MD  05/25/2019 4:05 PM     [facility] [address] [place] [address] [phone] (office) [phone] (fax)

## 2019-08-22 ENCOUNTER — Ambulatory Visit (INDEPENDENT_AMBULATORY_CARE_PROVIDER_SITE_OTHER): Payer: Self-pay | Admitting: Pediatric Endocrinology

## 2020-01-03 ENCOUNTER — Ambulatory Visit (INDEPENDENT_AMBULATORY_CARE_PROVIDER_SITE_OTHER): Payer: BLUE CROSS/BLUE SHIELD | Admitting: Internal Medicine

## 2020-01-03 ENCOUNTER — Encounter: Payer: Self-pay | Admitting: Internal Medicine

## 2020-01-03 ENCOUNTER — Other Ambulatory Visit: Payer: Self-pay

## 2020-01-03 VITALS — BP 119/73 | HR 58 | Ht 65.0 in | Wt 113.0 lb

## 2020-01-03 DIAGNOSIS — G901 Familial dysautonomia [Riley-Day]: Secondary | ICD-10-CM | POA: Diagnosis not present

## 2020-01-03 DIAGNOSIS — R55 Syncope and collapse: Secondary | ICD-10-CM | POA: Diagnosis not present

## 2020-01-03 NOTE — Progress Notes (Signed)
      Patient Care Team: Bing Neighbors, FNP as PCP - General (Family Medicine) Rodell Perna, NP as Consulting Physician (Obstetrics and Gynecology)   HPI  Pamela [name] is a 19 y.o. female Seen for dysautonomia in the setting of joint laxity and anxiety.  Of late she has had more symptoms.  She is just submitted her notice at the [employer] and will be working in a cooler work environment able to sit.  Her fluid intake has been reasonable but not great.  Her salt intake remains deplete.  Anxiety has been stable.  Not menstruating  Records and Results Reviewed  Past Medical History:  Diagnosis Date  . Anxiety   . Fatigue 12/09/2017  . Generalized anxiety disorder 11/27/2014  . Goiter   . Hypercholesterolemia without hypertriglyceridemia   . Hypothyroidism, acquired, autoimmune   . Pure hypercholesterolemia 09/25/2010  . Thyroiditis, autoimmune   . Underweight 05/12/2012    Past Surgical History:  Procedure Laterality Date  . NO PAST SURGERIES      Current Meds  Medication Sig  . levothyroxine (SYNTHROID) 50 MCG tablet TAKE 1 TABLET BY MOUTH ONCE DAILY BEFORE BREAKFAST  . pravastatin (PRAVACHOL) 20 MG tablet Take 1 tablet (20 mg total) by mouth daily.    No Known Allergies    Review of Systems negative except from HPI and PMH  Physical Exam BP 119/73   Pulse (!) 58   Ht 5\' 5"  (1.651 m)   Wt 113 lb (51.3 kg)   SpO2 100%   BMI 18.80 kg/m  Well developed and well nourished in no acute distress HENT normal E scleral and icterus clear Neck Supple JVP flat; carotids brisk and full Clear to ausculation Regular rate and rhythm, no murmurs gallops or rub Soft with active bowel sounds No clubbing cyanosis  Edema Alert and oriented, grossly normal motor and sensory function Skin Warm and Dry  ECG sinus at 58 Normal 13/07/42  CrCl cannot be calculated (Patient's most recent lab result is older than the maximum 21 days allowed.).   Assessment and   Plan Dysautonomia with evidence of POTS but also syncope  Arachnodactyly  Joint laxity  Anxiety  Worsening symptoms of late.  She will be changing her job to a environment which is neither hot nor require standing.  Hopefully this will help.  I am struck by the degree of symptoms not withstanding her vital signs today which were fairly normal.  We will continue to encourage compression wear.  She will look into [facility] clinic for referral.  I told her repeat vaginal exam referral.  Stress under better control     Current medicines are reviewed at length with the patient today .  The patient does not  have concerns regarding medicines.

## 2020-01-03 NOTE — Patient Instructions (Signed)
Medication Instructions:  Your physician recommends that you continue on your current medications as directed. Please refer to the Current Medication list given to you today.  *If you need a refill on your cardiac medications before your next appointment, please call your pharmacy*   Lab Work: None ordered.   If you have labs (blood work) drawn today and your tests are completely normal, you will receive your results only by: Marland Kitchen MyChart Message (if you have MyChart) OR . A paper copy in the mail If you have any lab test that is abnormal or we need to change your treatment, we will call you to review the results.   Testing/Procedures: None ordered.    Follow-Up: At [facility], you and your health needs are our priority.  As part of our continuing mission to provide you with exceptional heart care, we have created designated Provider Care Teams.  These Care Teams include your primary Cardiologist (physician) and Advanced Practice Providers (APPs -  Physician Assistants and Nurse Practitioners) who all work together to provide you with the care you need, when you need it.  We recommend signing up for the patient portal called "MyChart".  Sign up information is provided on this After Visit Summary.  MyChart is used to connect with patients for Virtual Visits (Telemedicine).  Patients are able to view lab/test results, encounter notes, upcoming appointments, etc.  Non-urgent messages can be sent to your provider as well.   To learn more about what you can do with MyChart, go to [email or web address].    Your next appointment:   6 month(s)  The format for your next appointment:   In Person  Provider:   Sherryl Manges, MD   Other Instructions Continue to increase hydration and use compression wear as discussed by Dr Graciela Husbands.

## 2020-03-07 ENCOUNTER — Ambulatory Visit
Admission: EM | Admit: 2020-03-07 | Discharge: 2020-03-07 | Disposition: A | Discharge status: Home / Self Care | Payer: BLUE CROSS/BLUE SHIELD | Attending: Emergency Medicine | Admitting: Emergency Medicine

## 2020-03-07 ENCOUNTER — Other Ambulatory Visit: Payer: Self-pay

## 2020-03-07 DIAGNOSIS — W5503XA Scratched by cat, initial encounter: Secondary | ICD-10-CM

## 2020-03-07 DIAGNOSIS — S0081XA Abrasion of other part of head, initial encounter: Secondary | ICD-10-CM | POA: Diagnosis not present

## 2020-03-07 DIAGNOSIS — Z23 Encounter for immunization: Secondary | ICD-10-CM | POA: Diagnosis not present

## 2020-03-07 MED ORDER — TETANUS-DIPHTH-ACELL PERTUSSIS 5-2.5-18.5 LF-MCG/0.5 IM SUSY
0.5000 mL | PREFILLED_SYRINGE | Freq: Once | INTRAMUSCULAR | Status: AC
  Administered 2020-03-07: 0.5 mL via INTRAMUSCULAR

## 2020-03-07 NOTE — ED Triage Notes (Signed)
Pt states her cat scratched her on her rt upper eye lid today. Dried blood to abrasion noted. Pt denies vision disturbance.

## 2020-03-07 NOTE — ED Provider Notes (Signed)
[name]    CSN: [id number] Arrival date & time: 03/07/20  1339      History   Chief Complaint Chief Complaint  Patient presents with  . Abrasion    HPI Pamela [name] is a 19 y.o. female  Patient presents for evaluation of cut of the right upper eyelid. She has noticed the above symptoms for 1 day.  Patient denies blurred vision, discharge, foreign body sensation, itching, photophobia and visual field deficit. There is a history of trauma: States her cat accidentally scratched her.  Tetanus status unknown.     Past Medical History:  Diagnosis Date  . Anxiety   . Fatigue 12/09/2017  . Generalized anxiety disorder 11/27/2014  . Goiter   . Hypercholesterolemia without hypertriglyceridemia   . Hypothyroidism, acquired, autoimmune   . Pure hypercholesterolemia 09/25/2010  . Thyroiditis, autoimmune   . Underweight 05/12/2012    Patient Active Problem List   Diagnosis Date Noted  . Dysautonomia (HCC) 05/24/2019  . Syncope 05/24/2019  . Fatigue 12/09/2017  . Generalized anxiety disorder 11/27/2014  . Underweight 05/12/2012  . Hypercholesterolemia without hypertriglyceridemia   . Thyroiditis, autoimmune   . Hypothyroidism, acquired, autoimmune   . Goiter   . Other specified acquired hypothyroidism 09/25/2010  . Pure hypercholesterolemia 09/25/2010    Past Surgical History:  Procedure Laterality Date  . NO PAST SURGERIES      OB History   No obstetric history on file.      Home Medications    Prior to Admission medications   Medication Sig Start Date End Date Taking? Authorizing Provider  levothyroxine (SYNTHROID) 50 MCG tablet TAKE 1 TABLET BY MOUTH ONCE DAILY BEFORE BREAKFAST 05/22/19   Dessa Phi, MD  pravastatin (PRAVACHOL) 20 MG tablet Take 1 tablet (20 mg total) by mouth daily. 05/22/19   Dessa Phi, MD    Family History Family History  Problem Relation Age of Onset  . Hyperlipidemia Mother   . Depression Mother   . Diabetes  Mother   . Hypertension Mother   . Thyroid disease Maternal Aunt   . Hyperlipidemia Maternal Grandmother   . Cancer Maternal Grandmother   . Graves' disease Maternal Grandmother        Treated with I-131 for Graves' disease.  . CAD Maternal Grandmother   . Hypertension Maternal Grandfather   . Diabetes Paternal Grandmother   . Heart disease Paternal Grandfather   . Heart attack Paternal Grandfather   . Depression Sister   . Anxiety disorder Sister   . Depression Maternal Aunt   . Heart attack Maternal Great-grandmother   . Stroke Neg Hx     Social History Social History   Tobacco Use  . Smoking status: Passive Smoke Exposure - Never Smoker  . Smokeless tobacco: Never Used  . Tobacco comment: family smokes outside  Substance Use Topics  . Alcohol use: No  . Drug use: No     Allergies   Patient has no known allergies.   Review of Systems Review of Systems  Constitutional: Negative for fever.  Eyes: Positive for pain. Negative for photophobia, discharge, redness, itching and visual disturbance.  All other systems reviewed and are negative.    Physical Exam Triage Vital Signs ED Triage Vitals [03/07/20 1352]  Enc Vitals Group     BP 125/90     Pulse Rate 95     Resp 18     Temp 98.9 F (37.2 C)     Temp Source Oral  SpO2 97 %     Weight      Height      Head Circumference      Peak Flow      Pain Score 2     Pain Loc      Pain Edu?      Excl. in GC?    No data found.  Updated Vital Signs BP 125/90 (BP Location: Left Arm)   Pulse 95   Temp 98.9 F (37.2 C) (Oral)   Resp 18   SpO2 97%   Visual Acuity Right Eye Distance:   Left Eye Distance:   Bilateral Distance:    Right Eye Near:   Left Eye Near:    Bilateral Near:     Physical Exam Constitutional:      General: She is not in acute distress.    Appearance: Normal appearance. She is not ill-appearing.  Eyes:     General: Lids are everted, no foreign bodies appreciated. Vision  grossly intact. Gaze aligned appropriately. No visual field deficit or scleral icterus.       Right eye: No foreign body, discharge or hordeolum.        Left eye: No foreign body, discharge or hordeolum.     Extraocular Movements: Extraocular movements intact.     Right eye: No nystagmus.     Left eye: No nystagmus.     Conjunctiva/sclera:     Right eye: Right conjunctiva is not injected. No chemosis, exudate or hemorrhage.    Left eye: Left conjunctiva is not injected. No chemosis, exudate or hemorrhage.    Pupils: Pupils are equal, round, and reactive to light.   Cardiovascular:     Rate and Rhythm: Normal rate.  Pulmonary:     Effort: Pulmonary effort is normal.     Breath sounds: No wheezing.  Musculoskeletal:     Cervical back: Neck supple. No tenderness.  Lymphadenopathy:     Cervical: No cervical adenopathy.  Skin:    Findings: No bruising, erythema or rash.      UC Treatments / Results  Labs (all labs ordered are listed, but only abnormal results are displayed) Labs Reviewed - No data to display  EKG   Radiology No results found.  Procedures Procedures (including critical care time)  Medications Ordered in UC Medications  Tdap (BOOSTRIX) injection 0.5 mL (0.5 mLs Intramuscular Given 03/07/20 1414)    Initial Impression / Assessment and Plan / UC Course  I have reviewed the triage vital signs and the nursing notes.  Pertinent labs & imaging results that were available during my care of the patient were reviewed by me and considered in my medical decision making (see chart for details).     Exam reassuring at this time: Low concern for secondary cellulitis.  Will treat supportively as below.  Tetanus updated.  Return precautions discussed, pt verbalized understanding and is agreeable to plan. Final Clinical Impressions(s) / UC Diagnoses   Final diagnoses:  Cat scratch of face, initial encounter     Discharge Instructions     Keep area(s) clean and  dry. Apply ice for 20 minutes 3-5 times daily. Return for worsening pain, redness, swelling, discharge, fever.    ED Prescriptions    None     PDMP not reviewed this encounter.   Hall-Potvin, [place], [place] 03/07/20 1422

## 2020-03-07 NOTE — Discharge Instructions (Addendum)
Keep area(s) clean and dry. Apply ice for 20 minutes 3-5 times daily. Return for worsening pain, redness, swelling, discharge, fever. 

## 2020-07-24 DIAGNOSIS — F4010 Social phobia, unspecified: Secondary | ICD-10-CM | POA: Diagnosis not present

## 2020-07-24 DIAGNOSIS — F410 Panic disorder [episodic paroxysmal anxiety] without agoraphobia: Secondary | ICD-10-CM | POA: Diagnosis not present

## 2020-07-24 DIAGNOSIS — F321 Major depressive disorder, single episode, moderate: Secondary | ICD-10-CM | POA: Diagnosis not present

## 2020-08-21 DIAGNOSIS — F321 Major depressive disorder, single episode, moderate: Secondary | ICD-10-CM | POA: Diagnosis not present

## 2020-08-21 DIAGNOSIS — F410 Panic disorder [episodic paroxysmal anxiety] without agoraphobia: Secondary | ICD-10-CM | POA: Diagnosis not present

## 2020-08-21 DIAGNOSIS — F4010 Social phobia, unspecified: Secondary | ICD-10-CM | POA: Diagnosis not present

## 2020-09-26 ENCOUNTER — Emergency Department (HOSPITAL_BASED_OUTPATIENT_CLINIC_OR_DEPARTMENT_OTHER): Payer: BLUE CROSS/BLUE SHIELD

## 2020-09-26 ENCOUNTER — Encounter (HOSPITAL_BASED_OUTPATIENT_CLINIC_OR_DEPARTMENT_OTHER): Payer: Self-pay | Admitting: Emergency Medicine

## 2020-09-26 ENCOUNTER — Other Ambulatory Visit: Payer: Self-pay

## 2020-09-26 ENCOUNTER — Emergency Department (HOSPITAL_BASED_OUTPATIENT_CLINIC_OR_DEPARTMENT_OTHER)
Admission: EM | Admit: 2020-09-26 | Discharge: 2020-09-26 | Disposition: A | Discharge status: Home / Self Care | Payer: BLUE CROSS/BLUE SHIELD | Attending: Emergency Medicine | Admitting: Emergency Medicine

## 2020-09-26 DIAGNOSIS — S0083XA Contusion of other part of head, initial encounter: Secondary | ICD-10-CM | POA: Insufficient documentation

## 2020-09-26 DIAGNOSIS — S060X0A Concussion without loss of consciousness, initial encounter: Secondary | ICD-10-CM | POA: Insufficient documentation

## 2020-09-26 DIAGNOSIS — Y9365 Activity, lacrosse and field hockey: Secondary | ICD-10-CM | POA: Diagnosis not present

## 2020-09-26 DIAGNOSIS — Z7722 Contact with and (suspected) exposure to environmental tobacco smoke (acute) (chronic): Secondary | ICD-10-CM | POA: Diagnosis not present

## 2020-09-26 DIAGNOSIS — S0990XA Unspecified injury of head, initial encounter: Secondary | ICD-10-CM

## 2020-09-26 DIAGNOSIS — E039 Hypothyroidism, unspecified: Secondary | ICD-10-CM | POA: Insufficient documentation

## 2020-09-26 DIAGNOSIS — Z79899 Other long term (current) drug therapy: Secondary | ICD-10-CM | POA: Diagnosis not present

## 2020-09-26 DIAGNOSIS — W21221A Struck by field hockey puck, initial encounter: Secondary | ICD-10-CM | POA: Insufficient documentation

## 2020-09-26 DIAGNOSIS — M7989 Other specified soft tissue disorders: Secondary | ICD-10-CM | POA: Diagnosis not present

## 2020-09-26 NOTE — ED Triage Notes (Signed)
Patient arrived via POV c/o head injury x 1 day. Patient states she was struck in the head with a hockey puck the night previously. Patient denies LOC. Patient has obvious hematoma on upper right forehead. Patient family observes that patient is not acting "normal" for them.Patient is AO x 4, VS WDL, normal gait.

## 2020-09-26 NOTE — Discharge Instructions (Signed)
You were seen in the emergency department for evaluation of a head injury.  You had a CAT scan that did not show any intracranial traumatic findings.  You are likely experiencing post head injury symptoms consistent with a concussion.  This will usually improve over time.  Please contact with our sports medicine for follow-up.

## 2020-09-26 NOTE — ED Provider Notes (Signed)
[facility] EMERGENCY DEPARTMENT Provider Note   CSN: [id number] Arrival date & time: 09/26/20  1941     History Chief Complaint  Patient presents with  . Head Injury    Pamela [name] is a 20 y.o. female.  She received a head injury yesterday when she was at the hockey game and a hockey puck hit her in the forehead.  There was no loss of consciousness.  Since then the patient states she feels more sluggish and is having memory lapses.  Nausea no vomiting.  No numbness or weakness.  Normal balance.  The history is provided by the patient.  Head Injury Location:  Frontal Time since incident:  24 hours Mechanism of injury: direct blow   Pain details:    Quality:  Throbbing   Severity:  Moderate   Timing:  Constant   Progression:  Unchanged Chronicity:  New Relieved by:  None tried Worsened by:  Nothing Ineffective treatments:  None tried Associated symptoms: headache, memory loss and nausea   Associated symptoms: no blurred vision, no focal weakness, no neck pain and no vomiting        Past Medical History:  Diagnosis Date  . Anxiety   . Fatigue 12/09/2017  . Generalized anxiety disorder 11/27/2014  . Goiter   . Hypercholesterolemia without hypertriglyceridemia   . Hypothyroidism, acquired, autoimmune   . Pure hypercholesterolemia 09/25/2010  . Thyroiditis, autoimmune   . Underweight 05/12/2012    Patient Active Problem List   Diagnosis Date Noted  . Dysautonomia (HCC) 05/24/2019  . Syncope 05/24/2019  . Fatigue 12/09/2017  . Generalized anxiety disorder 11/27/2014  . Underweight 05/12/2012  . Hypercholesterolemia without hypertriglyceridemia   . Thyroiditis, autoimmune   . Hypothyroidism, acquired, autoimmune   . Goiter   . Other specified acquired hypothyroidism 09/25/2010  . Pure hypercholesterolemia 09/25/2010    Past Surgical History:  Procedure Laterality Date  . NO PAST SURGERIES       OB History   No obstetric history on file.      Family History  Problem Relation Age of Onset  . Hyperlipidemia Mother   . Depression Mother   . Diabetes Mother   . Hypertension Mother   . Thyroid disease Maternal Aunt   . Hyperlipidemia Maternal Grandmother   . Cancer Maternal Grandmother   . Graves' disease Maternal Grandmother        Treated with I-131 for Graves' disease.  . CAD Maternal Grandmother   . Hypertension Maternal Grandfather   . Diabetes Paternal Grandmother   . Heart disease Paternal Grandfather   . Heart attack Paternal Grandfather   . Depression Sister   . Anxiety disorder Sister   . Depression Maternal Aunt   . Heart attack Maternal Great-grandmother   . Stroke Neg Hx     Social History   Tobacco Use  . Smoking status: Passive Smoke Exposure - Never Smoker  . Smokeless tobacco: Never Used  . Tobacco comment: family smokes outside  Vaping Use  . Vaping Use: Never used  Substance Use Topics  . Alcohol use: No  . Drug use: No    Home Medications Prior to Admission medications   Medication Sig Start Date End Date Taking? Authorizing Provider  levothyroxine (SYNTHROID) 50 MCG tablet TAKE 1 TABLET BY MOUTH ONCE DAILY BEFORE BREAKFAST 05/22/19   Dessa Phi, MD  pravastatin (PRAVACHOL) 20 MG tablet Take 1 tablet (20 mg total) by mouth daily. 05/22/19   Dessa Phi, MD    Allergies  Patient has no known allergies.  Review of Systems   Review of Systems  Constitutional: Negative for fever.  HENT: Negative for sore throat.   Eyes: Negative for blurred vision and visual disturbance.  Respiratory: Negative for shortness of breath.   Cardiovascular: Negative for chest pain.  Gastrointestinal: Positive for nausea. Negative for abdominal pain and vomiting.  Genitourinary: Negative for dysuria.  Musculoskeletal: Negative for neck pain.  Skin: Negative for rash.  Neurological: Positive for headaches. Negative for focal weakness.  Psychiatric/Behavioral: Positive for memory loss.     Physical Exam Updated Vital Signs BP 112/78 (BP Location: Right Arm)   Pulse (!) 55   Temp 98.7 F (37.1 C) (Oral)   Resp 16   Ht 5\' 4"  (1.626 m)   Wt 49.9 kg   LMP 09/12/2020 (Approximate)   SpO2 100%   BMI 18.88 kg/m   Physical Exam Vitals and nursing note reviewed.  Constitutional:      General: She is not in acute distress.    Appearance: Normal appearance. She is well-developed.  HENT:     Head: Normocephalic.     Comments: She has some contusion to her right upper forehead Eyes:     Conjunctiva/sclera: Conjunctivae normal.  Cardiovascular:     Rate and Rhythm: Normal rate and regular rhythm.     Heart sounds: No murmur heard.   Pulmonary:     Effort: Pulmonary effort is normal. No respiratory distress.     Breath sounds: Normal breath sounds.  Abdominal:     Palpations: Abdomen is soft.     Tenderness: There is no abdominal tenderness.  Musculoskeletal:     Cervical back: Neck supple.  Skin:    General: Skin is warm and dry.  Neurological:     General: No focal deficit present.     Mental Status: She is alert and oriented to person, place, and time.     Cranial Nerves: No cranial nerve deficit.     Sensory: No sensory deficit.     Motor: No weakness.     Gait: Gait normal.     ED Results / Procedures / Treatments   Labs (all labs ordered are listed, but only abnormal results are displayed) Labs Reviewed - No data to display  EKG None  Radiology CT Head Wo Contrast  Result Date: 09/26/2020 CLINICAL DATA:  Hit in head with hockey puck last night with forehead hematoma, initial encounter EXAM: CT HEAD WITHOUT CONTRAST TECHNIQUE: Contiguous axial images were obtained from the base of the skull through the vertex without intravenous contrast. COMPARISON:  None. FINDINGS: Brain: No evidence of acute infarction, hemorrhage, hydrocephalus, extra-axial collection or mass lesion/mass effect. Vascular: No hyperdense vessel or unexpected calcification.  Skull: Normal. Negative for fracture or focal lesion. Sinuses/Orbits: No acute finding. Other: Mild soft tissue swelling is noted in the right forehead consistent with the recent injury. IMPRESSION: Mild soft tissue changes consistent with the recent injury. No other focal abnormality is noted. Electronically Signed   By: 09/28/2020 M.D.   On: 09/26/2020 22:28    Procedures Procedures   Medications Ordered in ED Medications - No data to display  ED Course  I have reviewed the triage vital signs and the nursing notes.  Pertinent labs & imaging results that were available during my care of the patient were reviewed by me and considered in my medical decision making (see chart for details).  Clinical Course as of 09/27/20 0845  Thu Sep 26, 2020  2238 Viewed results with patient and her mother.  Recommended alternating Tylenol and ibuprofen for pain control.  We will give information regarding postconcussion clinic. [MB]    Clinical Course User Index [MB] Terrilee Files, MD   MDM Rules/Calculators/A&P                         20 year old female not on anticoagulation here after head injury sustained yesterday.  Complains of feeling foggy and out of sorts.  Imaging does not show any acute fracture or bleed.  Discussed natural course of head injury and concussions.  Given information regarding lumbar sports medicine concussion clinic.  Counseled on no contact sports.  Return instructions discussed  Final Clinical Impression(s) / ED Diagnoses Final diagnoses:  Closed head injury, initial encounter  Concussion without loss of consciousness, initial encounter    Rx / DC Orders ED Discharge Orders    None       Terrilee Files, MD 09/27/20 [phone]

## 2020-11-28 DIAGNOSIS — Z6821 Body mass index (BMI) 21.0-21.9, adult: Secondary | ICD-10-CM | POA: Diagnosis not present

## 2020-11-28 DIAGNOSIS — Z30011 Encounter for initial prescription of contraceptive pills: Secondary | ICD-10-CM | POA: Diagnosis not present

## 2020-11-28 DIAGNOSIS — Z01419 Encounter for gynecological examination (general) (routine) without abnormal findings: Secondary | ICD-10-CM | POA: Diagnosis not present

## 2020-11-28 DIAGNOSIS — N911 Secondary amenorrhea: Secondary | ICD-10-CM | POA: Diagnosis not present

## 2020-11-28 DIAGNOSIS — Z130 Encounter for screening for diseases of the blood and blood-forming organs and certain disorders involving the immune mechanism: Secondary | ICD-10-CM | POA: Diagnosis not present

## 2021-01-02 ENCOUNTER — Other Ambulatory Visit: Payer: Self-pay

## 2021-01-02 ENCOUNTER — Ambulatory Visit (INDEPENDENT_AMBULATORY_CARE_PROVIDER_SITE_OTHER): Payer: BLUE CROSS/BLUE SHIELD | Admitting: Pediatric Endocrinology

## 2021-01-02 ENCOUNTER — Encounter (INDEPENDENT_AMBULATORY_CARE_PROVIDER_SITE_OTHER): Payer: Self-pay | Admitting: Pediatric Endocrinology

## 2021-01-02 VITALS — BP 110/64 | HR 82 | Wt 124.0 lb

## 2021-01-02 DIAGNOSIS — E7800 Pure hypercholesterolemia, unspecified: Secondary | ICD-10-CM

## 2021-01-02 DIAGNOSIS — E063 Autoimmune thyroiditis: Secondary | ICD-10-CM

## 2021-01-02 MED ORDER — LEVOTHYROXINE SODIUM 50 MCG PO TABS: ORAL_TABLET | ORAL | 3 refills | Status: AC

## 2021-01-02 MED ORDER — PRAVASTATIN SODIUM 20 MG PO TABS: 20.0000 mg | ORAL_TABLET | Freq: Every day | ORAL | 3 refills | Status: AC

## 2021-01-02 NOTE — Patient Instructions (Addendum)
   Please have fasting labs drawn tomorrow (only water after midnight)  Please sign up for MyChart. This is how I will result your labs.   Please find a primary care doctor. This can be family practice or internal medicine. They will likely take over management of your thyroid and cholesterol. If you have not established with another provider- please return in 1 year. If you have established- please call and cancel the appointment here.

## 2021-01-02 NOTE — Progress Notes (Signed)
Subjective:  Patient Name: Pamela [name] Date of Birth: 03-13-2001  MRN: [id number]  Pamela [name]  presents to the office today for follow-up evaluation and management of her hypercholesterolemia, goiter, Hashimoto's thyroiditis, and hypothyroidism.   HISTORY OF PRESENT ILLNESS:   Pamela [name] is a 20 y.o. Caucasian female   Pamela [name] was unaccompanied  1. Pamela [name] was first referred to our clinic on 11/12/09 by her primary care pediatrician, Dr. Alena Bills, for evaluation and management of elevated cholesterol. Patient was then [birth year]20 years old. She had been previously quite healthy. Dr. Clarene Duke perform a routine screening for cholesterol in his office. When that result was high, he repeated the blood test. The second cholesterol was still high. Family history was positive for diabetes in a paternal grandmother. Mother and maternal aunt have had thyroid problems. Paternal grandfather had had a myocardial infarction. Maternal great-grandmother died of an MI. Mother's cholesterol was noted to be "a little high". Mother and maternal uncle were both obese. Paternal grandfather had hypertension. Review of Dr. Fredirick Maudlin laboratory results showed total cholesterols of 227 and 288. Triglycerides were 57 and 58. HDLs were both 57. LDLs were 159 and 160. Additional laboratory studies included a normal CMP. Her TSH was slightly elevated at 4.063. Her free T4 was 1.42. Her free T3 was 4.8. Her TPO antibody was borderline elevated at 38.4. Because of the association of hypothyroidism with hypercholesterolemia, we repeated the TFTs one month later. TSH was even higher at 5.823. Free T4 was 1.37. Free T3 was 5.2. Repeat TFTs 2 months later showed that the TSH was 3.514. Free T4 was 1.12. Free T3 was 4.3. At that point, Dr. Fransico Michael started her on Synthroid, 25 mcg per day. Her cholesterol values initially seemed to respond to the thyroid hormone. However, she did have a total cholesterol of 267 on 04/25/11 and was started on Pravachol at  that time. She initially had some concerns regarding headaches and stomachaches but they resolved.       2. The patient's last [facility] visit was on 05/22/19. In the interim, she has been generally healthy.    Thyroid  She has continued on 50 mcg of Synthroid daily. She says that she had a backlog of bottles at home from when she has been less than compliant with her dosing in the past. They were "just a little bit expired".  She feels fine on it. She says that she does miss some doses even now. She missed a dose last week. She has been known to double up but she did not last week.   Energy level is good She denies constipation She is on OCP and she is getting her period normally.  No changes with hair or skin No changes with weight.  She is working remotely for an [employer], site.   She is still having some syncope but "not as much"   Hyperlipidemia   She has continued on Pravachol. She is taking it daily.  She says that she has also had a back log of "only slightly expired" Pravachol at home which she has been taking most days.   She is taking 20 mg. When she was increased to 40 mg she had increased joint pain which resolved with resuming the lower dose.   She is still taking Prozac for her depression/anxiety. She is not currently seeing a therapist.    3. Pertinent Review of Systems:  Constitutional: The patient feels "fine".  Pain in hips/shoulders.   Eyes: Vision seems to be good. There  are no recognized eye problems. Wears glasses for reading Neck: The patient has no complaints of anterior neck swelling, soreness, tenderness, pressure, discomfort, or difficulty swallowing.   Heart: Heart rate increases with exercise or other physical activity. The patient has no complaints of palpitations, irregular heart beats, chest pain, or chest pressure.   Lungs no wheezing or breathing difficulty.  Gastrointestinal: Bowel movents seem normal. The patient has no complaints of excessive  hunger, acid reflux, upset stomach, stomach aches or pains, diarrhea, or constipation.  Legs: Muscle mass and strength seem normal. There are no complaints of numbness, tingling, burning, or pain. No edema is noted.  Feet: There are no obvious foot problems. There are no complaints of numbness, tingling, burning, or pain. No edema is noted. Neurologic: There are no recognized problems with muscle movement and strength, sensation, or coordination. Having issues with panic attacks and insomnia GYN/GU: On OCP. Regular menses  PAST MEDICAL, FAMILY, AND SOCIAL HISTORY  Past Medical History:  Diagnosis Date   Anxiety    Fatigue 12/09/2017   Generalized anxiety disorder 11/27/2014   Goiter    Hypercholesterolemia without hypertriglyceridemia    Hypothyroidism, acquired, autoimmune    Pure hypercholesterolemia 09/25/2010   Thyroiditis, autoimmune    Underweight 05/12/2012    Family History  Problem Relation Age of Onset   Hyperlipidemia Mother    Depression Mother    Diabetes Mother    Hypertension Mother    Thyroid disease Maternal Aunt    Hyperlipidemia Maternal Grandmother    Cancer Maternal Grandmother    Graves' disease Maternal Grandmother        Treated with I-131 for Graves' disease.   CAD Maternal Grandmother    Hypertension Maternal Grandfather    Diabetes Paternal Grandmother    Heart disease Paternal Grandfather    Heart attack Paternal Grandfather    Depression Sister    Anxiety disorder Sister    Depression Maternal Aunt    Heart attack Maternal Great-grandmother    Stroke Neg Hx      Current Outpatient Medications:    FLUoxetine (PROZAC) 10 MG capsule, Take 10 mg by mouth at bedtime., Disp: , Rfl:    LARIN 24 FE 1-20 MG-MCG(24) tablet, Take 1 tablet by mouth daily., Disp: , Rfl:    levothyroxine (SYNTHROID) 50 MCG tablet, TAKE 1 TABLET BY MOUTH ONCE DAILY BEFORE BREAKFAST, Disp: 90 tablet, Rfl: 3   pravastatin (PRAVACHOL) 20 MG tablet, Take 1 tablet (20 mg total) by  mouth daily., Disp: 90 tablet, Rfl: 3  Allergies as of 01/02/2021   (No Known Allergies)     reports that she is a non-smoker but has been exposed to tobacco smoke. She has never used smokeless tobacco. She reports that she does not drink alcohol and does not use drugs. Pediatric History  Patient Parents   Pamela [name], Pamela [name] (Mother)   Other Topics Concern   Not on file  Social History Narrative   Lives with Mom, dad, sister, and 1 dog. Plays outside. Very active. Not in school but is currently working.     Not active. Lives with parents and sister. Works remotely.  Primary Care Provider: Patient, No Pcp Per (Inactive)  ROS: There are no other significant problems involving [place]'s other body systems.   Objective:  Vital Signs:  BP 110/64   Pulse 82   Wt 124 lb (56.2 kg)   BMI 21.28 kg/m      05/22/2019  BP 102/60  Pulse 76  Weight 106 lb 4.8  oz  Height 5' 5.35" (1.66 m)  BMI (Calculated) 17.5   Ht Readings from Last 3 Encounters:  09/26/20 5\' 4"  (1.626 m)  01/03/20 5\' 5"  (1.651 m) (61 %, Z= 0.28)*  05/25/19 5\' 5"  (1.651 m) (61 %, Z= 0.28)*   * Growth percentiles are based on CDC (Girls, 2-20 Years) data.   Wt Readings from Last 3 Encounters:  01/02/21 124 lb (56.2 kg)  09/26/20 110 lb (49.9 kg)  01/03/20 113 lb (51.3 kg) (20 %, Z= -0.83)*   * Growth percentiles are based on CDC (Girls, 2-20 Years) data.   HC Readings from Last 3 Encounters:  No data found for [facility]   Body surface area is 1.59 meters squared. Facility age limit for growth percentiles is 20 years. Facility age limit for growth percentiles is 20 years.   PHYSICAL EXAM:  Constitutional: The patient appears healthy and well nourished. The patient's height and weight are normal for age. Weight is +18 pounds Head: The head is normocephalic.  Face: The face appears normal. There are no obvious dysmorphic features. Eyes: The eyes appear to be normally formed and spaced. Gaze is conjugate. There is no  obvious arcus or proptosis. Moisture appears normal. Ears: The ears are normally placed and appear externally normal. Multiple ear piercings.  Mouth: The oropharynx and tongue appear normal. Dentition appears to be normal for age. Oral moisture is normal. Neck: The neck appears to be visibly normal. The consistency of the thyroid gland is normal. The thyroid gland is not tender to palpation. Lungs: Normal work of breathing Heart: normal pulses and peripheral perfusion Abdomen: The abdomen appears to be normal in size for the patient's age. There is no obvious hepatomegaly, splenomegaly, or other mass effect.  Arms: Muscle size and bulk are normal for age. Hands:  Phalangeal and metacarpophalangeal joints are normal. Palmar muscles are normal for age. Palmar skin is normal. Palmar moisture is also normal. Legs: Muscles appear normal for age. No edema is present. Feet: Feet are normally formed. Dorsalis pedal pulses are normal. Neurologic: Strength is normal for age in both the upper and lower extremities. Muscle tone is normal. Sensation to touch is normal in both the legs and feet.    LAB DATA:   Pending No results found for this or any previous visit (from the past 504 hour(s)).   Assessment and Plan:   ASSESSMENT: Pamela [name] is a 20 y.o. caucasian female followed for autoimmune aquired hypothyroid and hyperlipidemia.    Hyperlipidemia - Currently taking expired Pravachol 20 mg - Had [employer] for lunch today - Will obtain fasting labs tomorrow  Thyroid - Currently on 50 mcg of expired levothyroxine daily - clinically euthyroid - Labs tomorrow with lipids  PLAN:  1. Diagnostic: Lab Orders         TSH         T4, free         Lipid panel     2. Therapeutic: No change to synthroid or Pravachol- prescriptions sent to pharmacy and coupons from [facility] 3. Patient education:  Discussion as above. Reviewed transition to adult care- she can continue to come here for now (until age 14). 4.  Follow-up: Return in about 1 year (around 01/02/2022).     26, MD  Level of Service: >30 minutes spent today reviewing the medical chart, counseling the patient/family, and documenting today's encounter.

## 2021-02-26 DIAGNOSIS — Z3041 Encounter for surveillance of contraceptive pills: Secondary | ICD-10-CM | POA: Diagnosis not present

## 2021-03-07 ENCOUNTER — Other Ambulatory Visit: Payer: Self-pay

## 2021-03-07 ENCOUNTER — Ambulatory Visit
Admission: EM | Admit: 2021-03-07 | Discharge: 2021-03-07 | Disposition: A | Discharge status: Home / Self Care | Payer: BC Managed Care – PPO | Attending: Physician Assistant | Admitting: Physician Assistant

## 2021-03-07 ENCOUNTER — Encounter: Payer: Self-pay | Admitting: Emergency Medicine

## 2021-03-07 DIAGNOSIS — J029 Acute pharyngitis, unspecified: Secondary | ICD-10-CM | POA: Diagnosis not present

## 2021-03-07 LAB — POCT RAPID STREP A (OFFICE): Rapid Strep A Screen: NEGATIVE

## 2021-03-07 NOTE — ED Provider Notes (Signed)
[name]    CSN: [id number] Arrival date & time: 03/07/21  1719      History   Chief Complaint Chief Complaint  Patient presents with   Sore Throat    HPI Pamela [name] is a 20 y.o. female.   Patient here today for evaluation of sore throat that started a few days ago. She reports that she has had mild congestion and cough. She denies fever. She has not had any vomiting or diarrhea. She has not tried any medication other than cough drops.   The history is provided by the patient.  Sore Throat Pertinent negatives include no abdominal pain and no shortness of breath.   Past Medical History:  Diagnosis Date   Anxiety    Fatigue 12/09/2017   Generalized anxiety disorder 11/27/2014   Goiter    Hypercholesterolemia without hypertriglyceridemia    Hypothyroidism, acquired, autoimmune    Pure hypercholesterolemia 09/25/2010   Thyroiditis, autoimmune    Underweight 05/12/2012    Patient Active Problem List   Diagnosis Date Noted   Dysautonomia (HCC) 05/24/2019   Syncope 05/24/2019   Fatigue 12/09/2017   Generalized anxiety disorder 11/27/2014   Underweight 05/12/2012   Hypercholesterolemia without hypertriglyceridemia    Thyroiditis, autoimmune    Hypothyroidism, acquired, autoimmune    Goiter    Other specified acquired hypothyroidism 09/25/2010   Pure hypercholesterolemia 09/25/2010    Past Surgical History:  Procedure Laterality Date   NO PAST SURGERIES      OB History   No obstetric history on file.      Home Medications    Prior to Admission medications   Medication Sig Start Date End Date Taking? Authorizing Provider  FLUoxetine (PROZAC) 10 MG capsule Take 10 mg by mouth at bedtime. 07/24/20  Yes [provider]  LARIN 24 FE 1-20 MG-MCG(24) tablet Take 1 tablet by mouth daily. 11/28/20  Yes [provider]  levothyroxine (SYNTHROID) 50 MCG tablet TAKE 1 TABLET BY MOUTH ONCE DAILY BEFORE BREAKFAST 01/02/21  Yes Dessa Phi, MD  pravastatin (PRAVACHOL) 20 MG tablet Take 1 tablet (20 mg total) by mouth daily. 01/02/21  Yes Dessa Phi, MD    Family History Family History  Problem Relation Age of Onset   Hyperlipidemia Mother    Depression Mother    Diabetes Mother    Hypertension Mother    Thyroid disease Maternal Aunt    Hyperlipidemia Maternal Grandmother    Cancer Maternal Grandmother    Luiz Blare' disease Maternal Grandmother        Treated with I-131 for Graves' disease.   CAD Maternal Grandmother    Hypertension Maternal Grandfather    Diabetes Paternal Grandmother    Heart disease Paternal Grandfather    Heart attack Paternal Grandfather    Depression Sister    Anxiety disorder Sister    Depression Maternal Aunt    Heart attack Maternal Great-grandmother    Stroke Neg Hx     Social History Social History   Tobacco Use   Smoking status: Passive Smoke Exposure - Never Smoker   Smokeless tobacco: Never   Tobacco comments:    family smokes outside  Vaping Use   Vaping Use: Never used  Substance Use Topics   Alcohol use: No   Drug use: No     Allergies   Patient has no known allergies.   Review of Systems Review of Systems  Constitutional:  Negative for chills and fever.  HENT:  Positive for congestion and sore  throat. Negative for ear pain and sinus pressure.   Eyes:  Negative for discharge and redness.  Respiratory:  Positive for cough. Negative for shortness of breath and wheezing.   Gastrointestinal:  Negative for abdominal pain, diarrhea, nausea and vomiting.    Physical Exam Triage Vital Signs ED Triage Vitals  Enc Vitals Group     BP 03/07/21 1756 130/84     Pulse Rate 03/07/21 1756 (!) 120     Resp --      Temp 03/07/21 1756 98.8 F (37.1 C)     Temp Source 03/07/21 1756 Oral     SpO2 03/07/21 1756 98 %     Weight 03/07/21 1757 120 lb (54.4 kg)     Height 03/07/21 1757 5\' 5"  (1.651 m)     Head Circumference --      Peak Flow --      Pain Score  03/07/21 1757 5     Pain Loc --      Pain Edu? --      Excl. in GC? --    No data found.  Updated Vital Signs BP 130/84 (BP Location: Left Arm)   Pulse (!) 120   Temp 98.8 F (37.1 C) (Oral)   Ht 5\' 5"  (1.651 m)   Wt 120 lb (54.4 kg)   SpO2 98%   BMI 19.97 kg/m     Physical Exam Vitals and nursing note reviewed.  Constitutional:      General: She is not in acute distress.    Appearance: Normal appearance. She is not ill-appearing.  HENT:     Head: Normocephalic and atraumatic.     Nose: Congestion present.     Mouth/Throat:     Mouth: Mucous membranes are moist.     Pharynx: Posterior oropharyngeal erythema present. No oropharyngeal exudate.  Eyes:     Conjunctiva/sclera: Conjunctivae normal.  Cardiovascular:     Rate and Rhythm: Normal rate and regular rhythm.     Heart sounds: Normal heart sounds. No murmur heard. Pulmonary:     Effort: Pulmonary effort is normal. No respiratory distress.     Breath sounds: Normal breath sounds. No wheezing, rhonchi or rales.  Skin:    General: Skin is warm and dry.  Neurological:     Mental Status: She is alert.  Psychiatric:        Mood and Affect: Mood normal.        Thought Content: Thought content normal.     UC Treatments / Results  Labs (all labs ordered are listed, but only abnormal results are displayed) Labs Reviewed  CULTURE, GROUP A STREP [facility])  POCT RAPID STREP A (OFFICE)    EKG   Radiology No results found.  Procedures Procedures (including critical care time)  Medications Ordered in UC Medications - No data to display  Initial Impression / Assessment and Plan / UC Course  I have reviewed the triage vital signs and the nursing notes.  Pertinent labs & imaging results that were available during my care of the patient were reviewed by me and considered in my medical decision making (see chart for details).   Strep test negative. Will order strep culture. Plan follow up if no improvement over the  next 4-5 days or sooner with any worsening. Recommended ibuprofen for pain relief in the meantime.   Final Clinical Impressions(s) / UC Diagnoses   Final diagnoses:  Acute pharyngitis, unspecified etiology   Discharge Instructions   None    ED  Prescriptions   None    PDMP not reviewed this encounter.   Tomi Bamberger, PA-C 03/07/21 1843

## 2021-03-07 NOTE — ED Triage Notes (Signed)
Patient c/o sore throat, cough and vomiting for 3 days.  Patient denies OTC meds.  Patient been using cough drops.  Patient is vaccinated for COVID.

## 2021-03-10 DIAGNOSIS — J029 Acute pharyngitis, unspecified: Secondary | ICD-10-CM | POA: Diagnosis not present

## 2021-03-10 DIAGNOSIS — B349 Viral infection, unspecified: Secondary | ICD-10-CM | POA: Diagnosis not present

## 2021-03-10 LAB — CULTURE, GROUP A STREP (THRC)

## 2021-09-26 DIAGNOSIS — E7800 Pure hypercholesterolemia, unspecified: Secondary | ICD-10-CM | POA: Diagnosis not present

## 2021-09-26 DIAGNOSIS — E039 Hypothyroidism, unspecified: Secondary | ICD-10-CM | POA: Diagnosis not present

## 2021-09-26 DIAGNOSIS — Z0000 Encounter for general adult medical examination without abnormal findings: Secondary | ICD-10-CM | POA: Diagnosis not present

## 2021-09-26 DIAGNOSIS — Z8639 Personal history of other endocrine, nutritional and metabolic disease: Secondary | ICD-10-CM | POA: Diagnosis not present
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
## 2022-01-01 ENCOUNTER — Ambulatory Visit (INDEPENDENT_AMBULATORY_CARE_PROVIDER_SITE_OTHER): Payer: BLUE CROSS/BLUE SHIELD | Admitting: Pediatric Endocrinology

## 2022-02-11 DIAGNOSIS — F410 Panic disorder [episodic paroxysmal anxiety] without agoraphobia: Secondary | ICD-10-CM | POA: Diagnosis not present

## 2022-02-11 DIAGNOSIS — F411 Generalized anxiety disorder: Secondary | ICD-10-CM | POA: Diagnosis not present

## 2022-03-13 DIAGNOSIS — F410 Panic disorder [episodic paroxysmal anxiety] without agoraphobia: Secondary | ICD-10-CM | POA: Diagnosis not present

## 2022-03-13 DIAGNOSIS — F411 Generalized anxiety disorder: Secondary | ICD-10-CM | POA: Diagnosis not present

## 2022-04-17 DIAGNOSIS — F410 Panic disorder [episodic paroxysmal anxiety] without agoraphobia: Secondary | ICD-10-CM | POA: Diagnosis not present

## 2022-04-17 DIAGNOSIS — F411 Generalized anxiety disorder: Secondary | ICD-10-CM | POA: Diagnosis not present

## 2022-04-28 ENCOUNTER — Ambulatory Visit
Admission: EM | Admit: 2022-04-28 | Discharge: 2022-04-28 | Disposition: A | Discharge status: Home / Self Care | Payer: BC Managed Care – PPO | Attending: Physician Assistant | Admitting: Physician Assistant

## 2022-04-28 DIAGNOSIS — Z1152 Encounter for screening for COVID-19: Secondary | ICD-10-CM

## 2022-04-28 DIAGNOSIS — R112 Nausea with vomiting, unspecified: Secondary | ICD-10-CM | POA: Diagnosis not present

## 2022-04-28 DIAGNOSIS — J069 Acute upper respiratory infection, unspecified: Secondary | ICD-10-CM | POA: Diagnosis not present

## 2022-04-28 LAB — POCT INFLUENZA A/B
Influenza A, POC: NEGATIVE
Influenza B, POC: NEGATIVE

## 2022-04-28 MED ORDER — ONDANSETRON 4 MG PO TBDP: 4.0000 mg | ORAL_TABLET | Freq: Three times a day (TID) | ORAL | 0 refills | Status: DC | PRN

## 2022-04-28 NOTE — ED Provider Notes (Signed)
[name]    CSN: [id number] Arrival date & time: 04/28/22  8657      History   Chief Complaint Chief Complaint  Patient presents with   Cough   Nausea    HPI Pamela [name] is a 21 y.o. female.   Patient here today for evaluation of clear productive cough and nausea that started yesterday.  She has not had any diarrhea.  She reports some mild sore throat.  She has taken over-the-counter medication without resolution.  She does report suspected fever the first day of illness but this has improved.  The history is provided by the patient.  Cough Associated symptoms: fever and sore throat   Associated symptoms: no ear pain, no eye discharge, no shortness of breath and no wheezing     Past Medical History:  Diagnosis Date   Anxiety    Fatigue 12/09/2017   Generalized anxiety disorder 11/27/2014   Goiter    Hypercholesterolemia without hypertriglyceridemia    Hypothyroidism, acquired, autoimmune    Pure hypercholesterolemia 09/25/2010   Thyroiditis, autoimmune    Underweight 05/12/2012    Patient Active Problem List   Diagnosis Date Noted   Dysautonomia (HCC) 05/24/2019   Syncope 05/24/2019   Fatigue 12/09/2017   Generalized anxiety disorder 11/27/2014   Underweight 05/12/2012   Hypercholesterolemia without hypertriglyceridemia    Thyroiditis, autoimmune    Hypothyroidism, acquired, autoimmune    Goiter    Other specified acquired hypothyroidism 09/25/2010   Pure hypercholesterolemia 09/25/2010    Past Surgical History:  Procedure Laterality Date   NO PAST SURGERIES      OB History   No obstetric history on file.      Home Medications    Prior to Admission medications   Medication Sig Start Date End Date Taking? Authorizing Provider  ondansetron (ZOFRAN-ODT) 4 MG disintegrating tablet Take 1 tablet (4 mg total) by mouth every 8 (eight) hours as needed. 04/28/22  Yes Tomi Bamberger, PA-C  FLUoxetine (PROZAC) 10 MG capsule Take 10 mg by  mouth at bedtime. 07/24/20   [provider]  LARIN 24 FE 1-20 MG-MCG(24) tablet Take 1 tablet by mouth daily. 11/28/20   [provider]  levothyroxine (SYNTHROID) 50 MCG tablet TAKE 1 TABLET BY MOUTH ONCE DAILY BEFORE BREAKFAST 01/02/21   Dessa Phi, MD  pravastatin (PRAVACHOL) 20 MG tablet Take 1 tablet (20 mg total) by mouth daily. 01/02/21   Dessa Phi, MD    Family History Family History  Problem Relation Age of Onset   Hyperlipidemia Mother    Depression Mother    Diabetes Mother    Hypertension Mother    Thyroid disease Maternal Aunt    Hyperlipidemia Maternal Grandmother    Cancer Maternal Grandmother    Graves' disease Maternal Grandmother        Treated with I-131 for Graves' disease.   CAD Maternal Grandmother    Hypertension Maternal Grandfather    Diabetes Paternal Grandmother    Heart disease Paternal Grandfather    Heart attack Paternal Grandfather    Depression Sister    Anxiety disorder Sister    Depression Maternal Aunt    Heart attack Maternal Great-grandmother    Stroke Neg Hx     Social History Social History   Tobacco Use   Smoking status: Passive Smoke Exposure - Never Smoker   Smokeless tobacco: Never   Tobacco comments:    family smokes outside  Vaping Use   Vaping Use: Never used  Substance  Use Topics   Alcohol use: No   Drug use: No     Allergies   Patient has no known allergies.   Review of Systems Review of Systems  Constitutional:  Positive for fever.  HENT:  Positive for congestion and sore throat. Negative for ear pain.   Eyes:  Negative for discharge and redness.  Respiratory:  Positive for cough. Negative for shortness of breath and wheezing.   Gastrointestinal:  Positive for nausea. Negative for abdominal pain, diarrhea and vomiting.     Physical Exam Triage Vital Signs ED Triage Vitals  Enc Vitals Group     BP 04/28/22 1125 123/82     Pulse Rate 04/28/22 1125 (!) 107     Resp 04/28/22 1125  18     Temp 04/28/22 1125 98.8 F (37.1 C)     Temp Source 04/28/22 1125 Oral     SpO2 04/28/22 1125 100 %     Weight --      Height --      Head Circumference --      Peak Flow --      Pain Score 04/28/22 1126 0     Pain Loc --      Pain Edu? --      Excl. in GC? --    No data found.  Updated Vital Signs BP 123/82 (BP Location: Right Arm)   Pulse (!) 107   Temp 98.8 F (37.1 C) (Oral)   Resp 18   SpO2 100%     Physical Exam Vitals and nursing note reviewed.  Constitutional:      General: She is not in acute distress.    Appearance: Normal appearance. She is not ill-appearing.  HENT:     Head: Normocephalic and atraumatic.     Nose: Congestion present.     Mouth/Throat:     Mouth: Mucous membranes are moist.     Pharynx: Posterior oropharyngeal erythema present. No oropharyngeal exudate.  Eyes:     Conjunctiva/sclera: Conjunctivae normal.  Cardiovascular:     Rate and Rhythm: Normal rate and regular rhythm.     Heart sounds: Normal heart sounds. No murmur heard. Pulmonary:     Effort: Pulmonary effort is normal. No respiratory distress.     Breath sounds: Normal breath sounds. No wheezing, rhonchi or rales.  Skin:    General: Skin is warm and dry.  Neurological:     Mental Status: She is alert.  Psychiatric:        Mood and Affect: Mood normal.        Thought Content: Thought content normal.      UC Treatments / Results  Labs (all labs ordered are listed, but only abnormal results are displayed) Labs Reviewed  POCT INFLUENZA A/B - Normal  SARS CORONAVIRUS 2 (TAT 6-24 HRS)    EKG   Radiology No results found.  Procedures Procedures (including critical care time)  Medications Ordered in UC Medications - No data to display  Initial Impression / Assessment and Plan / UC Course  I have reviewed the triage vital signs and the nursing notes.  Pertinent labs & imaging results that were available during my care of the patient were reviewed by me  and considered in my medical decision making (see chart for details).    Suspect viral etiology of symptoms.  Zofran prescribed for nausea.  Flu test negative in office but will order COVID screening for further evaluation.  Encouraged follow-up if no gradual improvement or  with any further concerns.  Final Clinical Impressions(s) / UC Diagnoses   Final diagnoses:  Encounter for screening for COVID-19  Acute upper respiratory infection  Nausea and vomiting, unspecified vomiting type   Discharge Instructions   None    ED Prescriptions     Medication Sig Dispense Auth. Provider   ondansetron (ZOFRAN-ODT) 4 MG disintegrating tablet Take 1 tablet (4 mg total) by mouth every 8 (eight) hours as needed. 20 tablet Tomi Bamberger, PA-C      PDMP not reviewed this encounter.   Tomi Bamberger, PA-C 04/28/22 1320

## 2022-04-28 NOTE — ED Triage Notes (Signed)
Pt presents with clear productive cough and nausea since yesterday.

## 2022-04-29 DIAGNOSIS — J101 Influenza due to other identified influenza virus with other respiratory manifestations: Secondary | ICD-10-CM | POA: Diagnosis not present

## 2022-04-29 LAB — SARS CORONAVIRUS 2 (TAT 6-24 HRS): SARS Coronavirus 2: NEGATIVE

## 2022-06-09 DIAGNOSIS — Z130 Encounter for screening for diseases of the blood and blood-forming organs and certain disorders involving the immune mechanism: Secondary | ICD-10-CM | POA: Diagnosis not present

## 2022-06-09 DIAGNOSIS — Z113 Encounter for screening for infections with a predominantly sexual mode of transmission: Secondary | ICD-10-CM | POA: Diagnosis not present

## 2022-06-09 DIAGNOSIS — Z01419 Encounter for gynecological examination (general) (routine) without abnormal findings: Secondary | ICD-10-CM | POA: Diagnosis not present

## 2022-09-29 DIAGNOSIS — Z0000 Encounter for general adult medical examination without abnormal findings: Secondary | ICD-10-CM | POA: Diagnosis not present

## 2022-09-29 DIAGNOSIS — E7800 Pure hypercholesterolemia, unspecified: Secondary | ICD-10-CM | POA: Diagnosis not present

## 2022-09-29 DIAGNOSIS — E039 Hypothyroidism, unspecified: Secondary | ICD-10-CM | POA: Diagnosis not present

## 2022-10-16 DIAGNOSIS — F411 Generalized anxiety disorder: Secondary | ICD-10-CM | POA: Diagnosis not present

## 2022-10-16 DIAGNOSIS — F410 Panic disorder [episodic paroxysmal anxiety] without agoraphobia: Secondary | ICD-10-CM | POA: Diagnosis not present

## 2022-10-20 DIAGNOSIS — B349 Viral infection, unspecified: Secondary | ICD-10-CM | POA: Diagnosis not present

## 2023-01-28 DIAGNOSIS — R509 Fever, unspecified: Secondary | ICD-10-CM | POA: Diagnosis not present

## 2023-01-28 DIAGNOSIS — R0609 Other forms of dyspnea: Secondary | ICD-10-CM | POA: Diagnosis not present

## 2023-01-28 DIAGNOSIS — J189 Pneumonia, unspecified organism: Secondary | ICD-10-CM | POA: Diagnosis not present

## 2023-01-28 DIAGNOSIS — R918 Other nonspecific abnormal finding of lung field: Secondary | ICD-10-CM | POA: Diagnosis not present

## 2023-01-28 DIAGNOSIS — Z03818 Encounter for observation for suspected exposure to other biological agents ruled out: Secondary | ICD-10-CM | POA: Diagnosis not present

## 2023-01-28 DIAGNOSIS — R051 Acute cough: Secondary | ICD-10-CM | POA: Diagnosis not present

## 2023-01-30 DIAGNOSIS — J189 Pneumonia, unspecified organism: Secondary | ICD-10-CM | POA: Diagnosis not present

## 2023-03-01 DIAGNOSIS — J069 Acute upper respiratory infection, unspecified: Secondary | ICD-10-CM | POA: Diagnosis not present

## 2023-06-25 ENCOUNTER — Ambulatory Visit
Admission: EM | Admit: 2023-06-25 | Discharge: 2023-06-25 | Disposition: A | Discharge status: Home / Self Care | Payer: Medicaid Other | Attending: Physician Assistant | Admitting: Physician Assistant

## 2023-06-25 DIAGNOSIS — J029 Acute pharyngitis, unspecified: Secondary | ICD-10-CM | POA: Diagnosis present

## 2023-06-25 LAB — POCT RAPID STREP A (OFFICE): Rapid Strep A Screen: NEGATIVE

## 2023-06-25 NOTE — Discharge Instructions (Signed)
  Try Cepacol Lozenges for sore throat.

## 2023-06-25 NOTE — ED Provider Notes (Signed)
[name]    CSN: [id number] Arrival date & time: 06/25/23  0903      History   Chief Complaint Chief Complaint  Patient presents with   Sore Throat    HPI Pamela [name] is a 23 y.o. female.   Patient presents today for evaluation of sore throat and hoarseness that started a few days ago suddenly. She reports that she has not had fever but has had stuffy nose and headache with some nausea but no vomiting. She has taken OTC meds without resolution.   The history is provided by the patient.  Sore Throat Associated symptoms include headaches. Pertinent negatives include no abdominal pain and no shortness of breath.    Past Medical History:  Diagnosis Date   Anxiety    Fatigue 12/09/2017   Generalized anxiety disorder 11/27/2014   Goiter    Hypercholesterolemia without hypertriglyceridemia    Hypothyroidism, acquired, autoimmune    Pure hypercholesterolemia 09/25/2010   Thyroiditis, autoimmune    Underweight 05/12/2012    Patient Active Problem List   Diagnosis Date Noted   Dysautonomia (HCC) 05/24/2019   Syncope 05/24/2019   Fatigue 12/09/2017   Generalized anxiety disorder 11/27/2014   Underweight 05/12/2012   Hypercholesterolemia without hypertriglyceridemia    Thyroiditis, autoimmune    Hypothyroidism, acquired, autoimmune    Goiter    Other specified acquired hypothyroidism 09/25/2010   Pure hypercholesterolemia 09/25/2010    Past Surgical History:  Procedure Laterality Date   NO PAST SURGERIES      OB History   No obstetric history on file.      Home Medications    Prior to Admission medications   Medication Sig Start Date End Date Taking? Authorizing Provider  ALPRAZolam (XANAX) 0.25 MG tablet Take 0.25 mg by mouth 2 (two) times daily as needed. 06/17/23  Yes [provider]  FLUoxetine (PROZAC) 20 MG capsule Take 20 mg by mouth daily.   Yes [provider]  levothyroxine (SYNTHROID) 50 MCG tablet TAKE 1 TABLET BY MOUTH  ONCE DAILY BEFORE BREAKFAST 01/02/21  Yes Dessa Phi, MD  pravastatin (PRAVACHOL) 20 MG tablet Take 1 tablet (20 mg total) by mouth daily. Patient taking differently: Take 40 mg by mouth daily. 01/02/21  Yes Dessa Phi, MD  albuterol (VENTOLIN HFA) 108 (90 Base) MCG/ACT inhaler Inhale 2 puffs into the lungs every 4 (four) hours as needed for wheezing or shortness of breath. 03/25/23   [provider]  benzonatate (TESSALON) 200 MG capsule Take 200 mg by mouth 3 (three) times daily as needed. 03/25/23   [provider]  doxycycline (VIBRA-TABS) 100 MG tablet Take 100 mg by mouth 2 (two) times daily. 03/25/23   [provider]  hydrOXYzine (ATARAX) 10 MG tablet Take 10 mg by mouth 2 (two) times daily. PRN    [provider]  ipratropium (ATROVENT) 0.06 % nasal spray Place 1 spray into both nostrils 4 (four) times daily.    [provider]  LARIN 24 FE 1-20 MG-MCG(24) tablet Take 1 tablet by mouth daily. 11/28/20   [provider]  lidocaine (XYLOCAINE) 2 % solution Use as directed 15 mLs in the mouth or throat as directed.    [provider]  naproxen (NAPROSYN) 500 MG tablet Take 500 mg by mouth as directed. 03/02/23   [provider]  Norethindrone Acetate-Ethinyl Estrad-FE (LARIN 24 FE) 1-20 MG-MCG(24) tablet Take 1 tablet by mouth daily.    [provider]  ondansetron (ZOFRAN-ODT) 4 MG  disintegrating tablet Take 1 tablet (4 mg total) by mouth every 8 (eight) hours as needed. 04/28/22   Tomi Bamberger, PA-C  oseltamivir (TAMIFLU) 75 MG capsule Take 1 capsule by mouth 2 (two) times daily.    [provider]    Family History Family History  Problem Relation Age of Onset   Hyperlipidemia Mother    Depression Mother    Diabetes Mother    Hypertension Mother    Depression Sister    Anxiety disorder Sister    Hyperlipidemia Maternal Grandmother    Cancer Maternal Grandmother    Graves' disease  Maternal Grandmother        Treated with I-131 for Graves' disease.   CAD Maternal Grandmother    Hypertension Maternal Grandfather    Diabetes Paternal Grandmother    Heart disease Paternal Grandfather    Heart attack Paternal Grandfather    Thyroid disease Maternal Aunt    Depression Maternal Aunt    Heart attack Maternal Great-grandmother    Stroke Neg Hx     Social History Social History   Tobacco Use   Smoking status: Passive Smoke Exposure - Never Smoker   Smokeless tobacco: Never   Tobacco comments:    family smokes outside  Vaping Use   Vaping status: Never Used  Substance Use Topics   Alcohol use: Yes    Comment: Occassionally.   Drug use: No     Allergies   Patient has no known allergies.   Review of Systems Review of Systems  Constitutional:  Negative for chills and fever.  HENT:  Positive for congestion, sore throat and voice change. Negative for ear pain.   Eyes:  Negative for discharge and redness.  Respiratory:  Negative for cough, shortness of breath and wheezing.   Gastrointestinal:  Positive for nausea. Negative for abdominal pain, diarrhea and vomiting.  Neurological:  Positive for headaches.     Physical Exam Triage Vital Signs ED Triage Vitals  Encounter Vitals Group     BP 06/25/23 1003 103/62     Systolic BP Percentile --      Diastolic BP Percentile --      Pulse Rate 06/25/23 1003 96     Resp 06/25/23 1003 18     Temp 06/25/23 1003 98.9 F (37.2 C)     Temp Source 06/25/23 1003 Oral     SpO2 06/25/23 1003 97 %     Weight 06/25/23 0958 110 lb (49.9 kg)     Height 06/25/23 0958 5\' 5"  (1.651 m)     Head Circumference --      Peak Flow --      Pain Score 06/25/23 0955 6     Pain Loc --      Pain Education --      Exclude from Growth Chart --    No data found.  Updated Vital Signs BP 103/62 (BP Location: Left Arm)   Pulse 96   Temp 98.9 F (37.2 C) (Oral)   Resp 18   Ht 5\' 5"  (1.651 m)   Wt 110 lb (49.9 kg)   LMP  06/07/2023 (Exact Date)   SpO2 97%   BMI 18.30 kg/m   Visual Acuity Right Eye Distance:   Left Eye Distance:   Bilateral Distance:    Right Eye Near:   Left Eye Near:    Bilateral Near:     Physical Exam Vitals and nursing note reviewed.  Constitutional:      General: She is  not in acute distress.    Appearance: Normal appearance. She is not ill-appearing.  HENT:     Head: Normocephalic and atraumatic.     Right Ear: Tympanic membrane normal.     Left Ear: Tympanic membrane normal.     Nose: Congestion present.     Mouth/Throat:     Mouth: Mucous membranes are moist.     Pharynx: Posterior oropharyngeal erythema present. No oropharyngeal exudate.  Eyes:     Conjunctiva/sclera: Conjunctivae normal.  Cardiovascular:     Rate and Rhythm: Normal rate and regular rhythm.     Heart sounds: Normal heart sounds. No murmur heard. Pulmonary:     Effort: Pulmonary effort is normal. No respiratory distress.     Breath sounds: Normal breath sounds. No wheezing, rhonchi or rales.  Skin:    General: Skin is warm and dry.  Neurological:     Mental Status: She is alert.  Psychiatric:        Mood and Affect: Mood normal.        Thought Content: Thought content normal.      UC Treatments / Results  Labs (all labs ordered are listed, but only abnormal results are displayed) Labs Reviewed  POCT RAPID STREP A (OFFICE) - Normal  CULTURE, GROUP A STREP [facility])    EKG   Radiology No results found.  Procedures Procedures (including critical care time)  Medications Ordered in UC Medications - No data to display  Initial Impression / Assessment and Plan / UC Course  I have reviewed the triage vital signs and the nursing notes.  Pertinent labs & imaging results that were available during my care of the patient were reviewed by me and considered in my medical decision making (see chart for details).    Rapid strep screening negative. Will order throat culture and advised  symptomatic treatment, increased fluids and rest while awaiting results. Encouraged follow up if no gradual improvement or with any further concerns.   Final Clinical Impressions(s) / UC Diagnoses   Final diagnoses:  Acute pharyngitis, unspecified etiology     Discharge Instructions       Try Cepacol Lozenges for sore throat.        ED Prescriptions   None    PDMP not reviewed this encounter.   Tomi Bamberger, PA-C 06/29/23 1836

## 2023-06-25 NOTE — ED Triage Notes (Signed)
"  This started with sore throat on Monday (really sudden) then hoarse voice and losing voice, random stuffy nose and nausea with ha". No fever known.

## 2023-06-28 LAB — CULTURE, GROUP A STREP (THRC)

## 2023-07-30 ENCOUNTER — Emergency Department (HOSPITAL_BASED_OUTPATIENT_CLINIC_OR_DEPARTMENT_OTHER)
Admission: EM | Admit: 2023-07-30 | Discharge: 2023-07-30 | Disposition: A | Discharge status: Home / Self Care | Attending: Emergency Medicine | Admitting: Emergency Medicine

## 2023-07-30 ENCOUNTER — Other Ambulatory Visit: Payer: Self-pay

## 2023-07-30 ENCOUNTER — Encounter (HOSPITAL_BASED_OUTPATIENT_CLINIC_OR_DEPARTMENT_OTHER): Payer: Self-pay | Admitting: Emergency Medicine

## 2023-07-30 DIAGNOSIS — R42 Dizziness and giddiness: Secondary | ICD-10-CM | POA: Insufficient documentation

## 2023-07-30 DIAGNOSIS — W228XXA Striking against or struck by other objects, initial encounter: Secondary | ICD-10-CM | POA: Insufficient documentation

## 2023-07-30 DIAGNOSIS — R110 Nausea: Secondary | ICD-10-CM | POA: Insufficient documentation

## 2023-07-30 DIAGNOSIS — S060X0A Concussion without loss of consciousness, initial encounter: Secondary | ICD-10-CM | POA: Diagnosis present

## 2023-07-30 DIAGNOSIS — E039 Hypothyroidism, unspecified: Secondary | ICD-10-CM | POA: Diagnosis not present

## 2023-07-30 MED ORDER — ONDANSETRON 4 MG PO TBDP
4.0000 mg | ORAL_TABLET | Freq: Once | ORAL | Status: AC | PRN
  Administered 2023-07-30: 4 mg via ORAL
  Filled 2023-07-30: qty 1

## 2023-07-30 MED ORDER — ONDANSETRON 4 MG PO TBDP: 4.0000 mg | ORAL_TABLET | Freq: Three times a day (TID) | ORAL | 0 refills | Status: DC | PRN

## 2023-07-30 NOTE — ED Provider Notes (Signed)
 [place] [facility] Provider Note   CSN: [id number] Arrival date & time: 07/30/23  1415     History  Chief Complaint  Patient presents with   Concussion    Pamela [name] is a 23 y.o. female.  HPI  This morning around 9 AM, patient hit her head on a wooden mantle when she bent down to pick something up.  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  She was also having dizziness, nausea without vomiting, headache, and light sensitivity.  She did not have any weakness, numbness, loss of consciousness.  This happened once before when she hit in the head with a hockey puck.  She was diagnosed with a concussion then and recovered well.     Home Medications Prior to Admission medications   Medication Sig Start Date End Date Taking? Authorizing Provider  ondansetron (ZOFRAN-ODT) 4 MG disintegrating tablet Take 1 tablet (4 mg total) by mouth every 8 (eight) hours as needed for nausea or vomiting. 07/30/23  Yes Logen Fowle, Earvin Hansen, MD  albuterol (VENTOLIN HFA) 108 (90 Base) MCG/ACT inhaler Inhale 2 puffs into the lungs every 4 (four) hours as needed for wheezing or shortness of breath. 03/25/23   [provider]  ALPRAZolam Prudy Feeler) 0.25 MG tablet Take 0.25 mg by mouth 2 (two) times daily as needed. 06/17/23   [provider]  benzonatate (TESSALON) 200 MG capsule Take 200 mg by mouth 3 (three) times daily as needed. 03/25/23   [provider]  doxycycline (VIBRA-TABS) 100 MG tablet Take 100 mg by mouth 2 (two) times daily. 03/25/23   [provider]  FLUoxetine (PROZAC) 20 MG capsule Take 20 mg by mouth daily.    [provider]  hydrOXYzine (ATARAX) 10 MG tablet Take 10 mg by mouth 2 (two) times daily. PRN    [provider]  ipratropium (ATROVENT) 0.06 % nasal spray Place 1 spray into both nostrils 4 (four) times daily.    [provider]  LARIN 24 FE 1-20 MG-MCG(24) tablet Take 1 tablet by mouth daily. 11/28/20   [provider]  levothyroxine (SYNTHROID) 50 MCG tablet TAKE 1 TABLET BY MOUTH ONCE DAILY BEFORE BREAKFAST 01/02/21   Dessa Phi, MD  lidocaine (XYLOCAINE) 2 % solution Use as directed 15 mLs in the mouth or throat as directed.    [provider]  naproxen (NAPROSYN) 500 MG tablet Take 500 mg by mouth as directed. 03/02/23   [provider]  Norethindrone Acetate-Ethinyl Estrad-FE (LARIN 24 FE) 1-20 MG-MCG(24) tablet Take 1 tablet by mouth daily.    [provider]  oseltamivir (TAMIFLU) 75 MG capsule Take 1 capsule by mouth 2 (two) times daily.    [provider]  pravastatin (PRAVACHOL) 20 MG tablet Take 1 tablet (20 mg total) by mouth daily. Patient taking differently: Take 40 mg by mouth daily. 01/02/21   Dessa Phi, MD      Allergies    Patient has no known allergies.    Review of Systems   Review of Systems  Neurological:  Positive for light-headedness and headaches. Negative for weakness and numbness.    Physical Exam Updated Vital Signs BP 133/88 (BP Location: Left Arm)   Pulse 81   Temp 97.8 F (36.6 C)   Resp 18   Ht 5\' 4"  (1.626 m)   Wt 52.2 kg   LMP 07/16/2023 (Approximate)  SpO2 100%   BMI 19.74 kg/m  Physical Exam Constitutional:      General: She is not in acute distress.    Appearance: Normal appearance. She is not ill-appearing or toxic-appearing.  HENT:     Head: Normocephalic and atraumatic.     Mouth/Throat:     Mouth: Mucous membranes are moist.     Pharynx: Oropharynx is clear.  Eyes:     Extraocular Movements: Extraocular movements intact.     Conjunctiva/sclera: Conjunctivae normal.     Pupils: Pupils are equal, round, and reactive to light.  Cardiovascular:     Rate and Rhythm: Normal rate.  Pulmonary:     Effort: Pulmonary effort is normal. No respiratory distress.  Abdominal:     General: Abdomen is flat.  There is no distension.  Musculoskeletal:        General: Normal range of motion.     Cervical back: Normal range of motion.  Skin:    General: Skin is warm and dry.  Neurological:     General: No focal deficit present.     Mental Status: She is alert.     Cranial Nerves: No cranial nerve deficit.     Sensory: No sensory deficit.     Motor: No weakness.     Coordination: Coordination normal.     Gait: Gait normal.  Psychiatric:        Mood and Affect: Mood normal.        Behavior: Behavior normal.     ED Results / Procedures / Treatments   Labs (all labs ordered are listed, but only abnormal results are displayed) Labs Reviewed - No data to display  EKG None  Radiology No results found.  Procedures Procedures    Medications Ordered in ED Medications  ondansetron (ZOFRAN-ODT) disintegrating tablet 4 mg (4 mg Oral Given 07/30/23 1425)    ED Course/ Medical Decision Making/ A&P                                 Medical Decision Making Risk Prescription drug management.   23 year old female with a history of acquired hypothyroidism due to Hashimoto's thyroiditis, HLD, dysautonomia, and GAD here after a concussion earlier in the day.  Vitals reassuring.  Exam without focal neurologic deficits.  Patient is conversing with me well in the bright exam room.  No indications for head imaging at this time.  Will discharge with PCP follow-up if symptoms do not improve.  Discussed gradual return to activity and decrease screen time.  Will send with Zofran for residual nausea and work note.  Patient understanding and agreeable with plan.        Final Clinical Impression(s) / ED Diagnoses Final diagnoses:  Concussion without loss of consciousness, initial encounter    Rx / DC Orders ED Discharge Orders          Ordered    ondansetron (ZOFRAN-ODT) 4 MG disintegrating tablet  Every 8 hours PRN        07/30/23 1558              Evette Georges, MD 07/30/23 4132     Glyn Ade, MD 07/31/23 [phone]

## 2023-07-30 NOTE — ED Triage Notes (Signed)
 Pt POV c/o head injury after hitting head on mantle appx 0900 today.  C/o dizziness, nausea, headache, light sensitivity. Denies LOC, denies blood thinners.    AOx4.

## 2023-07-30 NOTE — Discharge Instructions (Signed)
 You could have a mild concussion. I have included information to review regarding ease back into activity. Be sure to follow up with your PCP if your symptoms worsen.

## 2024-01-17 ENCOUNTER — Ambulatory Visit: Admission: EM | Admit: 2024-01-17 | Discharge: 2024-01-17 | Disposition: A | Discharge status: Home / Self Care

## 2024-01-17 DIAGNOSIS — L03113 Cellulitis of right upper limb: Secondary | ICD-10-CM

## 2024-01-17 DIAGNOSIS — W5503XA Scratched by cat, initial encounter: Secondary | ICD-10-CM | POA: Diagnosis not present

## 2024-01-17 MED ORDER — AZITHROMYCIN 250 MG PO TABS: 250.0000 mg | ORAL_TABLET | Freq: Every day | ORAL | 0 refills | Status: DC

## 2024-01-17 MED ORDER — DOXYCYCLINE HYCLATE 100 MG PO CAPS: 100.0000 mg | ORAL_CAPSULE | Freq: Two times a day (BID) | ORAL | 0 refills | Status: AC

## 2024-01-17 NOTE — ED Triage Notes (Signed)
 I got a cat scratch on my right arm (forearm) 2 days ago that is now red/swollen. No fever.

## 2024-01-23 NOTE — ED Provider Notes (Signed)
 [name]    CSN: [id number] Arrival date & time: 01/17/24  1751      History   Chief Complaint Chief Complaint  Patient presents with   Injury    HPI Pamela [name] is a 23 y.o. female.   Patient here today for evaluation of swelling and redness around a cat scratch to her right forearm that occurred 2 days ago.  She denies any fever.  She is up-to-date on Tdap vaccinations.  The history is provided by the patient.  Injury Pertinent negatives include no abdominal pain and no shortness of breath.    Past Medical History:  Diagnosis Date   Anxiety    Fatigue 12/09/2017   Generalized anxiety disorder 11/27/2014   Goiter    Hypercholesterolemia without hypertriglyceridemia    Hypothyroidism, acquired, autoimmune    Pure hypercholesterolemia 09/25/2010   Thyroiditis, autoimmune    Underweight 05/12/2012    Patient Active Problem List   Diagnosis Date Noted   Dysautonomia (HCC) 05/24/2019   Syncope 05/24/2019   Fatigue 12/09/2017   Generalized anxiety disorder 11/27/2014   Underweight 05/12/2012   Hypercholesterolemia without hypertriglyceridemia    Thyroiditis, autoimmune    Hypothyroidism, acquired, autoimmune    Goiter    Other specified acquired hypothyroidism 09/25/2010   Pure hypercholesterolemia 09/25/2010    Past Surgical History:  Procedure Laterality Date   NO PAST SURGERIES      OB History   No obstetric history on file.      Home Medications    Prior to Admission medications   Medication Sig Start Date End Date Taking? Authorizing Provider  ALPRAZolam (XANAX) 0.5 MG tablet SMARTSIG:1 Tablet(s) By Mouth 10/11/23  Yes [provider]  azithromycin  (ZITHROMAX ) 250 MG tablet Take 1 tablet (250 mg total) by mouth daily. Take first 2 tablets together, then 1 every day until finished. 01/17/24  Yes Billy Asberry FALCON, PA-C  doxycycline  (VIBRAMYCIN ) 100 MG capsule Take 1 capsule (100 mg total) by mouth 2 (two) times daily for 7 days.  01/17/24 01/24/24 Yes Billy Asberry FALCON, PA-C  ibuprofen (ADVIL) 600 MG tablet Take 600 mg by mouth 3 (three) times daily. 10/29/23  Yes [provider]  ketoconazole (NIZORAL) 2 % cream Apply topically daily. 09/29/23  Yes [provider]  predniSONE (DELTASONE) 10 MG tablet Take by mouth. 10/01/23  Yes [provider]  terbinafine (LAMISIL) 250 MG tablet Take 250 mg by mouth daily. 09/29/23  Yes [provider]  albuterol (VENTOLIN HFA) 108 (90 Base) MCG/ACT inhaler Inhale 2 puffs into the lungs every 4 (four) hours as needed for wheezing or shortness of breath. 03/25/23   [provider]  ALPRAZolam (XANAX) 0.25 MG tablet Take 0.25 mg by mouth 2 (two) times daily as needed. 06/17/23   [provider]  benzonatate (TESSALON) 200 MG capsule Take 200 mg by mouth 3 (three) times daily as needed. 03/25/23   [provider]  FLUoxetine (PROZAC) 20 MG capsule Take 20 mg by mouth daily.    [provider]  hydrOXYzine (ATARAX) 10 MG tablet Take 10 mg by mouth 2 (two) times daily. PRN    [provider]  ipratropium (ATROVENT) 0.06 % nasal spray Place 1 spray into both nostrils 4 (four) times daily.    [provider]  LARIN 24 FE 1-20 MG-MCG(24) tablet Take 1 tablet by mouth daily. 11/28/20   [provider]  levothyroxine  (SYNTHROID ) 50 MCG tablet TAKE 1 TABLET BY MOUTH ONCE DAILY BEFORE  BREAKFAST 01/02/21   Dorrene Nest, MD  lidocaine (XYLOCAINE) 2 % solution Use as directed 15 mLs in the mouth or throat as directed.    [provider]  naproxen (NAPROSYN) 500 MG tablet Take 500 mg by mouth as directed. 03/02/23   [provider]  Norethindrone Acetate-Ethinyl Estrad-FE (LARIN 24 FE) 1-20 MG-MCG(24) tablet Take 1 tablet by mouth daily.    [provider]  ondansetron  (ZOFRAN -ODT) 4 MG disintegrating tablet Take 1 tablet (4 mg total) by mouth every 8 (eight) hours as needed for nausea or  vomiting. 07/30/23   Tharon Lung, MD  oseltamivir (TAMIFLU) 75 MG capsule Take 1 capsule by mouth 2 (two) times daily.    [provider]  pravastatin  (PRAVACHOL ) 20 MG tablet Take 1 tablet (20 mg total) by mouth daily. Patient taking differently: Take 40 mg by mouth daily. 01/02/21   Dorrene Nest, MD    Family History Family History  Problem Relation Age of Onset   Hyperlipidemia Mother    Depression Mother    Diabetes Mother    Hypertension Mother    Depression Sister    Anxiety disorder Sister    Hyperlipidemia Maternal Grandmother    Cancer Maternal Grandmother    Graves' disease Maternal Grandmother        Treated with I-131 for Graves' disease.   CAD Maternal Grandmother    Hypertension Maternal Grandfather    Diabetes Paternal Grandmother    Heart disease Paternal Grandfather    Heart attack Paternal Grandfather    Thyroid  disease Maternal Aunt    Depression Maternal Aunt    Heart attack Maternal Great-grandmother    Stroke Neg Hx     Social History Social History   Tobacco Use   Smoking status: Passive Smoke Exposure - Never Smoker   Smokeless tobacco: Never   Tobacco comments:    family smokes outside  Vaping Use   Vaping status: Never Used  Substance Use Topics   Alcohol use: Yes    Comment: Occassionally.   Drug use: No     Allergies   Patient has no known allergies.   Review of Systems Review of Systems  Constitutional:  Negative for chills and fever.  Eyes:  Negative for discharge and redness.  Respiratory:  Negative for shortness of breath.   Gastrointestinal:  Negative for abdominal pain, nausea and vomiting.  Skin:  Positive for color change and wound.     Physical Exam Triage Vital Signs ED Triage Vitals  Encounter Vitals Group     BP 01/17/24 1905 120/78     Girls Systolic BP Percentile --      Girls Diastolic BP Percentile --      Boys Systolic BP Percentile --      Boys Diastolic BP Percentile --      Pulse Rate  01/17/24 1905 70     Resp 01/17/24 1905 16     Temp 01/17/24 1905 98.5 F (36.9 C)     Temp Source 01/17/24 1905 Oral     SpO2 01/17/24 1905 98 %     Weight 01/17/24 1903 120 lb (54.4 kg)     Height 01/17/24 1903 5' 4 (1.626 m)     Head Circumference --      Peak Flow --      Pain Score 01/17/24 1901 4     Pain Loc --      Pain Education --      Exclude from Growth Chart --  No data found.  Updated Vital Signs BP 120/78 (BP Location: Left Arm)   Pulse 70   Temp 98.5 F (36.9 C) (Oral)   Resp 16   Ht 5' 4 (1.626 m)   Wt 120 lb (54.4 kg)   LMP 12/29/2023 (Exact Date)   SpO2 98%   BMI 20.60 kg/m   Visual Acuity Right Eye Distance:   Left Eye Distance:   Bilateral Distance:    Right Eye Near:   Left Eye Near:    Bilateral Near:     Physical Exam Vitals and nursing note reviewed.  Constitutional:      General: She is not in acute distress.    Appearance: Normal appearance. She is not ill-appearing.  HENT:     Head: Normocephalic and atraumatic.  Eyes:     Conjunctiva/sclera: Conjunctivae normal.  Cardiovascular:     Rate and Rhythm: Normal rate.  Pulmonary:     Effort: Pulmonary effort is normal. No respiratory distress.  Skin:    Findings: Erythema present.     Comments: See photo  Neurological:     Mental Status: She is alert.  Psychiatric:        Mood and Affect: Mood normal.        Behavior: Behavior normal.        Thought Content: Thought content normal.      UC Treatments / Results  Labs (all labs ordered are listed, but only abnormal results are displayed) Labs Reviewed - No data to display  EKG   Radiology No results found.  Procedures Procedures (including critical care time)  Medications Ordered in UC Medications - No data to display  Initial Impression / Assessment and Plan / UC Course  I have reviewed the triage vital signs and the nursing notes.  Pertinent labs & imaging results that were available during my care of the  patient were reviewed by me and considered in my medical decision making (see chart for details).    Will treat to cover infection as well as cat scratch with azithromycin  and doxycycline .  Encouraged follow-up if no gradual improvement or with any further concerns.  Patient expressed understanding.  Final Clinical Impressions(s) / UC Diagnoses   Final diagnoses:  Cat scratch  Cellulitis of right upper extremity   Discharge Instructions   None    ED Prescriptions     Medication Sig Dispense Auth. Provider   azithromycin  (ZITHROMAX ) 250 MG tablet Take 1 tablet (250 mg total) by mouth daily. Take first 2 tablets together, then 1 every day until finished. 6 tablet Billy Stabs F, PA-C   doxycycline  (VIBRAMYCIN ) 100 MG capsule Take 1 capsule (100 mg total) by mouth 2 (two) times daily for 7 days. 14 capsule Billy Stabs FALCON, PA-C      PDMP not reviewed this encounter.   Billy Stabs FALCON, PA-C 01/23/24 1408

## 2024-04-19 ENCOUNTER — Encounter: Payer: Self-pay | Admitting: Emergency Medicine

## 2024-04-19 ENCOUNTER — Ambulatory Visit
Admission: EM | Admit: 2024-04-19 | Discharge: 2024-04-19 | Disposition: A | Discharge status: Home / Self Care | Attending: Family Medicine | Admitting: Family Medicine

## 2024-04-19 DIAGNOSIS — S060X0A Concussion without loss of consciousness, initial encounter: Secondary | ICD-10-CM

## 2024-04-19 DIAGNOSIS — R110 Nausea: Secondary | ICD-10-CM

## 2024-04-19 MED ORDER — ONDANSETRON 4 MG PO TBDP
4.0000 mg | ORAL_TABLET | Freq: Once | ORAL | Status: AC
  Administered 2024-04-19: 4 mg via ORAL

## 2024-04-19 MED ORDER — KETOROLAC TROMETHAMINE 30 MG/ML IJ SOLN
30.0000 mg | Freq: Once | INTRAMUSCULAR | Status: AC
  Administered 2024-04-19: 30 mg via INTRAMUSCULAR

## 2024-04-19 MED ORDER — ONDANSETRON 4 MG PO TBDP: 4.0000 mg | ORAL_TABLET | Freq: Three times a day (TID) | ORAL | 0 refills | Status: AC | PRN

## 2024-04-19 NOTE — ED Triage Notes (Signed)
 Pt hit the right side of her head two days ago while she bent over to pet dog and stood up quickly hitting head on shelf.  Pt presents due to feeling nauseous, dizzy, and having brain fog.

## 2024-04-19 NOTE — Discharge Instructions (Signed)
 You were seen today for a concussion following a head injury.  I have given you a shot of pain medication and nausea medication today.  I have sent more zofran  to your pharmacy to help with nausea.  You may take over the counter excedrin migraine to see if helpful for your headache.  You should rest the next several days with gradual return to normal activity.  If you have continued symptoms, you should follow up with your primary care provider for further care and discussion.

## 2024-04-19 NOTE — ED Provider Notes (Signed)
 [facility]    CSN: [id number] Arrival date & time: 04/19/24  1223      History   Chief Complaint Chief Complaint  Patient presents with   Head Injury   Nausea    HPI ERABELLA [name] is a 23 y.o. female.    Head Injury Associated symptoms: nausea    Patient is here for head injury/concussion symptoms.  2 days ago she hit her head on a shelf.  Was bending over to pet a dow and stood up quickly hitting the shelf.  Having headache, nausea without vomiting, fatigue, and brain fog.  Some dizziness.  This is her third concussion over the course of her lifetime, the latest being march of this year.  She does not have medications at home for nausea.       Past Medical History:  Diagnosis Date   Anxiety    Fatigue 12/09/2017   Generalized anxiety disorder 11/27/2014   Goiter    Hypercholesterolemia without hypertriglyceridemia    Hypothyroidism, acquired, autoimmune    Pure hypercholesterolemia 09/25/2010   Thyroiditis, autoimmune    Underweight 05/12/2012    Patient Active Problem List   Diagnosis Date Noted   Dysautonomia (HCC) 05/24/2019   Syncope 05/24/2019   Fatigue 12/09/2017   Generalized anxiety disorder 11/27/2014   Underweight 05/12/2012   Hypercholesterolemia without hypertriglyceridemia    Thyroiditis, autoimmune    Hypothyroidism, acquired, autoimmune    Goiter    Other specified acquired hypothyroidism 09/25/2010   Pure hypercholesterolemia 09/25/2010    Past Surgical History:  Procedure Laterality Date   NO PAST SURGERIES      OB History   No obstetric history on file.      Home Medications    Prior to Admission medications   Medication Sig Start Date End Date Taking? Authorizing Provider  albuterol (VENTOLIN HFA) 108 (90 Base) MCG/ACT inhaler Inhale 2 puffs into the lungs every 4 (four) hours as needed for wheezing or shortness of breath. 03/25/23   [provider]  ALPRAZolam (XANAX) 0.25 MG tablet Take 0.25 mg by  mouth 2 (two) times daily as needed. 06/17/23   [provider]  ALPRAZolam (XANAX) 0.5 MG tablet SMARTSIG:1 Tablet(s) By Mouth 10/11/23   [provider]  azithromycin  (ZITHROMAX ) 250 MG tablet Take 1 tablet (250 mg total) by mouth daily. Take first 2 tablets together, then 1 every day until finished. Patient not taking: Reported on 04/19/2024 01/17/24   Billy Asberry FALCON, PA-C  benzonatate (TESSALON) 200 MG capsule Take 200 mg by mouth 3 (three) times daily as needed. 03/25/23   [provider]  FLUoxetine (PROZAC) 20 MG capsule Take 20 mg by mouth daily.    [provider]  hydrOXYzine (ATARAX) 10 MG tablet Take 10 mg by mouth 2 (two) times daily. PRN    [provider]  ibuprofen (ADVIL) 600 MG tablet Take 600 mg by mouth 3 (three) times daily. 10/29/23   [provider]  ipratropium (ATROVENT) 0.06 % nasal spray Place 1 spray into both nostrils 4 (four) times daily.    [provider]  ketoconazole (NIZORAL) 2 % cream Apply topically daily. 09/29/23   [provider]  LARIN 24 FE 1-20 MG-MCG(24) tablet Take 1 tablet by mouth daily. 11/28/20   [provider]  levothyroxine  (SYNTHROID ) 50 MCG tablet TAKE 1 TABLET BY MOUTH ONCE DAILY BEFORE BREAKFAST 01/02/21   Dorrene Nest, MD  lidocaine (XYLOCAINE) 2 % solution Use as directed 15 mLs in  the mouth or throat as directed.    [provider]  naproxen (NAPROSYN) 500 MG tablet Take 500 mg by mouth as directed. 03/02/23   [provider]  Norethindrone Acetate-Ethinyl Estrad-FE (LARIN 24 FE) 1-20 MG-MCG(24) tablet Take 1 tablet by mouth daily.    [provider]  ondansetron  (ZOFRAN -ODT) 4 MG disintegrating tablet Take 1 tablet (4 mg total) by mouth every 8 (eight) hours as needed for nausea or vomiting. 07/30/23   Tharon Lung, MD  oseltamivir (TAMIFLU) 75 MG capsule Take 1 capsule by mouth 2 (two) times daily. Patient not taking: Reported on  04/19/2024    [provider]  pravastatin  (PRAVACHOL ) 20 MG tablet Take 1 tablet (20 mg total) by mouth daily. Patient taking differently: Take 40 mg by mouth daily. 01/02/21   Dorrene Nest, MD  predniSONE (DELTASONE) 10 MG tablet Take by mouth. Patient not taking: Reported on 04/19/2024 10/01/23   [provider]  terbinafine (LAMISIL) 250 MG tablet Take 250 mg by mouth daily. 09/29/23   [provider]    Family History Family History  Problem Relation Age of Onset   Hyperlipidemia Mother    Depression Mother    Diabetes Mother    Hypertension Mother    Depression Sister    Anxiety disorder Sister    Hyperlipidemia Maternal Grandmother    Cancer Maternal Grandmother    Graves' disease Maternal Grandmother        Treated with I-131 for Graves' disease.   CAD Maternal Grandmother    Hypertension Maternal Grandfather    Diabetes Paternal Grandmother    Heart disease Paternal Grandfather    Heart attack Paternal Grandfather    Thyroid  disease Maternal Aunt    Depression Maternal Aunt    Heart attack Maternal Great-grandmother    Stroke Neg Hx     Social History Social History   Tobacco Use   Smoking status: Passive Smoke Exposure - Never Smoker   Smokeless tobacco: Never   Tobacco comments:    family smokes outside  Vaping Use   Vaping status: Never Used  Substance Use Topics   Alcohol use: Yes    Comment: Occassionally.   Drug use: No     Allergies   Patient has no known allergies.   Review of Systems Review of Systems  Constitutional:  Positive for fatigue.  HENT: Negative.    Respiratory: Negative.    Cardiovascular: Negative.   Gastrointestinal:  Positive for nausea.  Musculoskeletal: Negative.   Neurological:  Positive for dizziness.  Psychiatric/Behavioral: Negative.       Physical Exam Triage Vital Signs ED Triage Vitals  Encounter Vitals Group     BP 04/19/24 1228 113/72     Girls Systolic BP Percentile --       Girls Diastolic BP Percentile --      Boys Systolic BP Percentile --      Boys Diastolic BP Percentile --      Pulse Rate 04/19/24 1228 73     Resp 04/19/24 1228 17     Temp 04/19/24 1228 98.2 F (36.8 C)     Temp Source 04/19/24 1228 Oral     SpO2 04/19/24 1228 98 %     Weight --      Height --      Head Circumference --      Peak Flow --      Pain Score 04/19/24 1231 3     Pain Loc --  Pain Education --      Exclude from Growth Chart --    No data found.  Updated Vital Signs BP 113/72 (BP Location: Right Arm)   Pulse 73   Temp 98.2 F (36.8 C) (Oral)   Resp 17   LMP 04/12/2024 (Approximate)   SpO2 98%   Visual Acuity Right Eye Distance:   Left Eye Distance:   Bilateral Distance:    Right Eye Near:   Left Eye Near:    Bilateral Near:     Physical Exam Constitutional:      General: She is not in acute distress.    Appearance: Normal appearance. She is normal weight. She is not ill-appearing or toxic-appearing.  HENT:     Head: Normocephalic and atraumatic.  Eyes:     General: Lids are normal.     Extraocular Movements: Extraocular movements intact.     Conjunctiva/sclera: Conjunctivae normal.     Pupils: Pupils are equal, round, and reactive to light.  Cardiovascular:     Rate and Rhythm: Normal rate and regular rhythm.  Pulmonary:     Effort: Pulmonary effort is normal.     Breath sounds: Normal breath sounds.  Neurological:     General: No focal deficit present.     Mental Status: She is alert and oriented to person, place, and time.     Cranial Nerves: No cranial nerve deficit.     Sensory: No sensory deficit.     Motor: No weakness.     Coordination: Coordination normal.     Gait: Gait normal.  Psychiatric:        Mood and Affect: Mood normal.        Behavior: Behavior normal.      UC Treatments / Results  Labs (all labs ordered are listed, but only abnormal results are displayed) Labs Reviewed - No data to  display  EKG   Radiology No results found.  Procedures Procedures (including critical care time)  Medications Ordered in UC Medications  ondansetron  (ZOFRAN -ODT) disintegrating tablet 4 mg (has no administration in time range)  ketorolac (TORADOL) 30 MG/ML injection 30 mg (has no administration in time range)    Initial Impression / Assessment and Plan / UC Course  I have reviewed the triage vital signs and the nursing notes.  Pertinent labs & imaging results that were available during my care of the patient were reviewed by me and considered in my medical decision making (see chart for details).    Patient is here after head injury with concussion.  No alarming symptoms to warrant imaging.  Discussed care, and return to normal activities.  Follow up with her pcp if not improving as expected.   Final Clinical Impressions(s) / UC Diagnoses   Final diagnoses:  Concussion without loss of consciousness, initial encounter  Nausea without vomiting     Discharge Instructions      You were seen today for a concussion following a head injury.  I have given you a shot of pain medication and nausea medication today.  I have sent more zofran  to your pharmacy to help with nausea.  You may take over the counter excedrin migraine to see if helpful for your headache.  You should rest the next several days with gradual return to normal activity.  If you have continued symptoms, you should follow up with your primary care provider for further care and discussion.     ED Prescriptions     Medication Sig Dispense Auth.  Provider   ondansetron  (ZOFRAN -ODT) 4 MG disintegrating tablet Take 1 tablet (4 mg total) by mouth every 8 (eight) hours as needed for nausea or vomiting. 20 tablet Darral Longs, MD      PDMP not reviewed this encounter.   Darral Longs, MD 04/19/24 1255

## 2024-04-27 ENCOUNTER — Encounter (HOSPITAL_BASED_OUTPATIENT_CLINIC_OR_DEPARTMENT_OTHER): Payer: Self-pay

## 2024-04-27 ENCOUNTER — Emergency Department (HOSPITAL_BASED_OUTPATIENT_CLINIC_OR_DEPARTMENT_OTHER)
Admission: EM | Admit: 2024-04-27 | Discharge: 2024-04-27 | Disposition: A | Discharge status: Home / Self Care | Attending: Emergency Medicine | Admitting: Emergency Medicine

## 2024-04-27 ENCOUNTER — Other Ambulatory Visit: Payer: Self-pay

## 2024-04-27 ENCOUNTER — Ambulatory Visit: Payer: Self-pay

## 2024-04-27 DIAGNOSIS — W19XXXA Unspecified fall, initial encounter: Secondary | ICD-10-CM | POA: Insufficient documentation

## 2024-04-27 DIAGNOSIS — S0990XA Unspecified injury of head, initial encounter: Secondary | ICD-10-CM | POA: Insufficient documentation

## 2024-04-27 NOTE — ED Provider Notes (Signed)
 " [place] [facility] AT [facility] Provider Note   CSN: [id number] Arrival date & time: 04/27/24  1622     Patient presents with: Head Injury (12/14)   Pamela [name] is a 23 y.o. female.   Patient to ED with symptoms described as brain fog unable to concentrate well, nausea without vomiting, photophobia. Symptoms started 4 days ago when she suffered a minor head head injury when she ran into on object. No LOC at the time. She reports multiple head injuries in the past. She feels she may have a concussion.   The history is provided by the patient. No language interpreter was used.  Head Injury      Prior to Admission medications  Medication Sig Start Date End Date Taking? Authorizing Provider  albuterol (VENTOLIN HFA) 108 (90 Base) MCG/ACT inhaler Inhale 2 puffs into the lungs every 4 (four) hours as needed for wheezing or shortness of breath. 03/25/23   [provider]  ALPRAZolam (XANAX) 0.25 MG tablet Take 0.25 mg by mouth 2 (two) times daily as needed. 06/17/23   [provider]  ALPRAZolam (XANAX) 0.5 MG tablet SMARTSIG:1 Tablet(s) By Mouth 10/11/23   [provider]  FLUoxetine (PROZAC) 20 MG capsule Take 20 mg by mouth daily.    [provider]  hydrOXYzine (ATARAX) 10 MG tablet Take 10 mg by mouth 2 (two) times daily. PRN    [provider]  ibuprofen (ADVIL) 600 MG tablet Take 600 mg by mouth 3 (three) times daily. 10/29/23   [provider]  ipratropium (ATROVENT) 0.06 % nasal spray Place 1 spray into both nostrils 4 (four) times daily.    [provider]  ketoconazole (NIZORAL) 2 % cream Apply topically daily. 09/29/23   [provider]  LARIN 24 FE 1-20 MG-MCG(24) tablet Take 1 tablet by mouth daily. 11/28/20   [provider]  levothyroxine  (SYNTHROID ) 50 MCG tablet TAKE 1 TABLET BY MOUTH ONCE DAILY BEFORE BREAKFAST 01/02/21   Dorrene Nest, MD  lidocaine (XYLOCAINE) 2 %  solution Use as directed 15 mLs in the mouth or throat as directed.    [provider]  naproxen (NAPROSYN) 500 MG tablet Take 500 mg by mouth as directed. 03/02/23   [provider]  Norethindrone Acetate-Ethinyl Estrad-FE (LARIN 24 FE) 1-20 MG-MCG(24) tablet Take 1 tablet by mouth daily.    [provider]  ondansetron  (ZOFRAN -ODT) 4 MG disintegrating tablet Take 1 tablet (4 mg total) by mouth every 8 (eight) hours as needed for nausea or vomiting. 04/19/24   Piontek, Rocky, MD  pravastatin  (PRAVACHOL ) 20 MG tablet Take 1 tablet (20 mg total) by mouth daily. Patient taking differently: Take 40 mg by mouth daily. 01/02/21   Dorrene Nest, MD    Allergies: Patient has no known allergies.    Review of Systems  Updated Vital Signs BP 114/74 (BP Location: Right Arm)   Pulse 85   Temp 99 F (37.2 C)   Resp 18   LMP 04/12/2024 (Approximate)   SpO2 99%   Physical Exam Vitals and nursing note reviewed.  Constitutional:      Appearance: She is well-developed.  HENT:     Head: Normocephalic.  Cardiovascular:     Rate and Rhythm: Normal rate.  Pulmonary:     Effort: Pulmonary effort is normal.  Abdominal:     Palpations: Abdomen is soft.     Tenderness: There is no abdominal tenderness. There is no guarding or rebound.  Musculoskeletal:  General: Normal range of motion.     Cervical back: Normal range of motion and neck supple.  Skin:    General: Skin is warm and dry.  Neurological:     General: No focal deficit present.     Mental Status: She is alert and oriented to person, place, and time.     GCS: GCS eye subscore is 4. GCS verbal subscore is 5. GCS motor subscore is 6.     Cranial Nerves: No cranial nerve deficit, dysarthria or facial asymmetry.     Sensory: Sensation is intact.     Motor: No pronator drift.     Coordination: Finger-Nose-Finger Test normal.     Deep Tendon Reflexes:     Reflex Scores:      Patellar reflexes are 2+ on the  right side and 2+ on the left side.    (all labs ordered are listed, but only abnormal results are displayed) Labs Reviewed - No data to display  EKG: None  Radiology: No results found.   Procedures   Medications Ordered in the ED - No data to display  Clinical Course as of 04/29/24 2342  Thu Apr 27, 2024  1750 Patient to ED 4 days after minor head injury reporting symptoms of difficulty concentrating, nausea without vomiting, mild light sensitivity. No neurologic deficits on exam. No imaging is indicated. Patient comfortable with decision. Will recommend supportive care, Zofran  for nausea, PCP follow up.  [SU]    Clinical Course User Index [SU] Odell Balls, PA-C                                 Medical Decision Making       Final diagnoses:  Minor head injury without loss of consciousness, initial encounter    ED Discharge Orders     None          Odell Balls, PA-C 04/29/24 2342    Pamela Ozell LABOR, DO 05/04/24 1319  "

## 2024-04-27 NOTE — ED Triage Notes (Signed)
 Pt c/o head injury 12/14, got too hot & passed out. Advises really bad brain fog, memory problems since. Nausea, light sensitivity since. Seen for similar instance 12/10 that was another time I hit my head, the light sensitivity was since then. Advises she tried to go to UC again, they told me to come here.   Zofran  for nausea PTA, relief of nausea.

## 2024-04-27 NOTE — Discharge Instructions (Signed)
 As we discussed, your neurologic exam is totally normal indicating no serious intracranial injury. Take Tylenol if needed.   If symptoms worsen, return to the ED for further evaluation.
